# Patient Record
Sex: Female | Born: 1970 | Hispanic: Yes | Marital: Married | Smoking: Never smoker
Health system: Southern US, Community
[De-identification: ages and names within clinical notes are randomized; demographics above are authoritative.]

## PROBLEM LIST (undated history)

## (undated) DIAGNOSIS — E785 Hyperlipidemia, unspecified: Secondary | ICD-10-CM

## (undated) DIAGNOSIS — I10 Essential (primary) hypertension: Secondary | ICD-10-CM

## (undated) DIAGNOSIS — K219 Gastro-esophageal reflux disease without esophagitis: Secondary | ICD-10-CM

## (undated) DIAGNOSIS — K819 Cholecystitis, unspecified: Secondary | ICD-10-CM

## (undated) DIAGNOSIS — E119 Type 2 diabetes mellitus without complications: Secondary | ICD-10-CM

## (undated) DIAGNOSIS — K429 Umbilical hernia without obstruction or gangrene: Secondary | ICD-10-CM

## (undated) HISTORY — DX: Hyperlipidemia, unspecified: E78.5

## (undated) HISTORY — DX: Gastro-esophageal reflux disease without esophagitis: K21.9

## (undated) HISTORY — PX: OTHER SURGICAL HISTORY: SHX169

## (undated) HISTORY — DX: Umbilical hernia without obstruction or gangrene: K42.9

## (undated) HISTORY — DX: Type 2 diabetes mellitus without complications: E11.9

## (undated) HISTORY — DX: Cholecystitis, unspecified: K81.9

---

## 2006-09-09 ENCOUNTER — Ambulatory Visit: Payer: Self-pay | Admitting: Family Medicine

## 2007-09-26 ENCOUNTER — Emergency Department: Payer: Self-pay | Admitting: Emergency Medicine

## 2007-09-27 ENCOUNTER — Ambulatory Visit: Payer: Self-pay | Admitting: Internal Medicine

## 2007-10-09 ENCOUNTER — Emergency Department: Payer: Self-pay | Admitting: Emergency Medicine

## 2010-04-02 ENCOUNTER — Ambulatory Visit: Payer: Self-pay | Admitting: Gastroenterology

## 2010-04-05 LAB — PATHOLOGY REPORT

## 2012-08-23 ENCOUNTER — Ambulatory Visit: Payer: Self-pay | Admitting: Gastroenterology

## 2012-10-06 ENCOUNTER — Ambulatory Visit: Payer: Self-pay | Admitting: Obstetrics and Gynecology

## 2012-10-26 ENCOUNTER — Ambulatory Visit: Payer: Self-pay | Admitting: Gastroenterology

## 2016-07-22 ENCOUNTER — Other Ambulatory Visit: Payer: Self-pay | Admitting: Physician Assistant

## 2016-07-22 DIAGNOSIS — K432 Incisional hernia without obstruction or gangrene: Secondary | ICD-10-CM

## 2016-07-28 ENCOUNTER — Ambulatory Visit
Admission: RE | Admit: 2016-07-28 | Discharge: 2016-07-28 | Disposition: A | Discharge status: Home / Self Care | Payer: PRIVATE HEALTH INSURANCE | Source: Ambulatory Visit | Attending: Physician Assistant | Admitting: Physician Assistant

## 2016-07-28 DIAGNOSIS — Z90721 Acquired absence of ovaries, unilateral: Secondary | ICD-10-CM | POA: Insufficient documentation

## 2016-07-28 DIAGNOSIS — K432 Incisional hernia without obstruction or gangrene: Secondary | ICD-10-CM

## 2016-07-28 DIAGNOSIS — R109 Unspecified abdominal pain: Secondary | ICD-10-CM | POA: Diagnosis present

## 2017-02-04 DIAGNOSIS — E1169 Type 2 diabetes mellitus with other specified complication: Secondary | ICD-10-CM | POA: Insufficient documentation

## 2017-02-04 DIAGNOSIS — Z794 Long term (current) use of insulin: Secondary | ICD-10-CM

## 2017-02-04 DIAGNOSIS — E1142 Type 2 diabetes mellitus with diabetic polyneuropathy: Secondary | ICD-10-CM | POA: Insufficient documentation

## 2017-02-04 DIAGNOSIS — E785 Hyperlipidemia, unspecified: Secondary | ICD-10-CM

## 2017-10-28 ENCOUNTER — Other Ambulatory Visit: Payer: Self-pay | Admitting: Physician Assistant

## 2017-10-28 DIAGNOSIS — R1013 Epigastric pain: Secondary | ICD-10-CM

## 2017-11-09 ENCOUNTER — Ambulatory Visit
Admission: RE | Admit: 2017-11-09 | Discharge: 2017-11-09 | Disposition: A | Discharge status: Home / Self Care | Payer: PRIVATE HEALTH INSURANCE | Source: Ambulatory Visit | Attending: Physician Assistant | Admitting: Physician Assistant

## 2017-11-09 DIAGNOSIS — K8020 Calculus of gallbladder without cholecystitis without obstruction: Secondary | ICD-10-CM | POA: Insufficient documentation

## 2017-11-09 DIAGNOSIS — R1013 Epigastric pain: Secondary | ICD-10-CM

## 2017-11-09 DIAGNOSIS — R160 Hepatomegaly, not elsewhere classified: Secondary | ICD-10-CM | POA: Insufficient documentation

## 2017-12-31 ENCOUNTER — Telehealth: Payer: Self-pay

## 2017-12-31 NOTE — Telephone Encounter (Signed)
Lacey Flynn, Lacey GoAngela K  Jivan Symanski, CMA        Would you please call patient/ Referral is for calculus of gallbladder. Referral is to Dr Earlene Plateravis. Thanks.    Called patient and the phone will only ring and not able to leave a voicemail. I will try again later.

## 2018-01-06 NOTE — Telephone Encounter (Signed)
Called patient and was not able to leave her a voicemail. I will send her a letter in Spanish.  

## 2018-01-19 ENCOUNTER — Ambulatory Visit: Payer: PRIVATE HEALTH INSURANCE | Admitting: Surgery

## 2018-01-19 ENCOUNTER — Encounter: Payer: Self-pay | Admitting: Surgery

## 2018-01-19 VITALS — BP 130/68 | HR 74 | Temp 97.5°F | Ht 62.0 in | Wt 290.0 lb

## 2018-01-19 DIAGNOSIS — R1013 Epigastric pain: Secondary | ICD-10-CM

## 2018-01-19 DIAGNOSIS — G8929 Other chronic pain: Secondary | ICD-10-CM | POA: Diagnosis not present

## 2018-01-19 NOTE — Progress Notes (Signed)
Surgical Clinic History and Physical  Referring provider:  Rosemarie Beath, PA-C [address] [place], [place] [address]  HISTORY OF PRESENT ILLNESS (HPI):  47 y.o. female presents for evaluation of epigastric and upper abdominal pain. Patient reports she first began to experience epigastric and upper abdominal pain 1.5 years ago, at which time her primary care physician ordered for patient an abdominal ultrasound, which reportedly demonstrated gallstones and biliary sludge. She says her pain is worse after eating and particularly worse after fatty foods such as meats, cheeses/dairy, and fried foods. She also describes chronic GERD, for which she was previously prescribed omeprazole, but 6 months ago, her prescription lapsed while she was referred from primary care to endocrinologist. She resumed taking omeprazole over the past week, since which her GERD has substantially improved/resolved, but she continues to experience post-prandial epigastric abdominal pain. Patient also says she underwent EGD and colonoscopy 5 years ago and was told she had polyps, but has not seen GI for follow-up. She last saw endocrinologist with new labs drawn 8/29, though last labs and note in Care Everywhere are from 07/2017. Patient otherwise denies any N/V, blood per rectum, unintentional weight loss, fever/chills, CP, or SOB.   Of note, patient also says she previously underwent laparoscopic removal of unilateral fallopian tube following what sounds like an ectopic pregnancy, at which time she was also diagnosed with an umbilical hernia, which only causes mild discomfort when she holds her granddaughter. She was evaluated by [facility] surgery for hernia repair and was referred for bariatric surgery. However, because she is self-pay and couldn't afford the $10,000, she never pursued bariatric weight loss program any further despite interest and ongoing difficulty with weight loss and diabetes control.  PAST MEDICAL  HISTORY (PMH):  Past Medical History:  Diagnosis Date  . Cholecystitis   . Diabetes mellitus without complication (HCC)   . GERD (gastroesophageal reflux disease)   . Hyperlipidemia   . Umbilical hernia      PAST SURGICAL HISTORY (PSH):  Past Surgical History:  Procedure Laterality Date  . PARTIAL HYSTERECTOMY  1998   Has one ovary     MEDICATIONS:  Prior to Admission medications   Not on File     ALLERGIES:  Allergies  Allergen Reactions  . Metronidazole Other (See Comments)  . Penicillin G Nausea And Vomiting     SOCIAL HISTORY:  Social History   Socioeconomic History  . Marital status: Married    Spouse name: Not on file  . Number of children: Not on file  . Years of education: Not on file  . Highest education level: Not on file  Occupational History  . Not on file  Social Needs  . Financial resource strain: Not on file  . Food insecurity:    Worry: Not on file    Inability: Not on file  . Transportation needs:    Medical: Not on file    Non-medical: Not on file  Tobacco Use  . Smoking status: Never Smoker  . Smokeless tobacco: Never Used  Substance and Sexual Activity  . Alcohol use: Never    Frequency: Never  . Drug use: Never  . Sexual activity: Not on file  Lifestyle  . Physical activity:    Days per week: Not on file    Minutes per session: Not on file  . Stress: Not on file  Relationships  . Social connections:    Talks on phone: Not on file    Gets together: Not  on file    Attends religious service: Not on file    Active member of club or organization: Not on file    Attends meetings of clubs or organizations: Not on file    Relationship status: Not on file  . Intimate partner violence:    Fear of current or ex partner: Not on file    Emotionally abused: Not on file    Physically abused: Not on file    Forced sexual activity: Not on file  Other Topics Concern  . Not on file  Social History Narrative  . Not on file    The patient  currently resides (home / rehab facility / nursing home): Home The patient normally is (ambulatory / bedbound): Ambulatory  FAMILY HISTORY:  No family history on file.  Otherwise negative/non-contributory.  REVIEW OF SYSTEMS:  Constitutional: denies any other weight loss, fever, chills, or sweats  Eyes: denies any other vision changes, history of eye injury  ENT: denies sore throat, hearing problems  Respiratory: denies shortness of breath, wheezing  Cardiovascular: denies chest pain, palpitations  Gastrointestinal: abdominal pain, N/V, and bowel function as per HPI Musculoskeletal: denies any other joint pains or cramps  Skin: Denies any other rashes or skin discolorations Neurological: denies any other headache, dizziness, weakness  Psychiatric: Denies any other depression, anxiety   All other review of systems were otherwise negative   VITAL SIGNS:  BP 130/68   Pulse 74   Temp (!) 97.5 F (36.4 C) (Skin)   Ht 5\' 2"  (1.575 m)   Wt 290 lb (131.5 kg)   BMI 53.04 kg/m   PHYSICAL EXAM:  Constitutional:  -- Morbidly obese body habitus  -- Awake, alert, and oriented x3  Eyes:  -- Pupils equally round and reactive to light  -- No scleral icterus  Ear, nose, throat:  -- No jugular venous distension -- No nasal drainage, bleeding Pulmonary:  -- No crackles  -- Equal decreased breath sounds bilaterally, attributable to body habitus -- Breathing non-labored at rest Cardiovascular:  -- S1, S2 present  -- No pericardial rubs  Gastrointestinal:  -- Abdomen soft and obese, but non-distended, with mild epigastric and RUQ tenderness to deep palpation, no guarding/rebound tenderness  -- No abdominal masses appreciated, pulsatile or otherwise; specifically unable to appreciate on exam any umbilical hernia Musculoskeletal and Integumentary:  -- Wounds or skin discoloration: None appreciated -- Extremities: B/L UE and LE FROM, hands and feet warm Neurologic:  -- Motor function:  Intact and symmetric -- Sensation: Intact and symmetric  Labs: CBC: No results found for: WBC, RBC BMP: No results found for: GLUCOSE, POTASSIUM, CHLORIDE, CO2, BUN, CREATININE, CALCIUM   Chemistries (07/23/2017): Glucose 166, BUN 5, Cr 0.5, Na 136, K 4.3, Cl 101, CO2 31.1, Albumin 3.7, Bilirubin (total) 0.3, AST 23, ALT 27   HbA1C (07/23/2017): 9.9  Imaging studies:  Limited RUQ Abdominal Ultrasound (11/09/2017) Gallbladder: Mixed echogenicity material layer dependently within the gallbladder. Negative sonographic Murphy's sign. No gallbladder distention. No gallbladder wall thickening or pericholecystic fluid. Common bile duct: Diameter: 2 mm-3 mm  Heterogeneous liver echotexture with no nodular contour. Flow within the hepatic vasculature. Estimated right liver cranial caudal span is greater than 25 cm.   Assessment/Plan:  47 y.o. female with symptomatic cholelithiasis, complicated by GERD and by co-morbidities including morbid obesity (BMI >53), poorly controlled DM, HLD, GERD, and chronic lower back pain.   - will contact endocrinologist for recent HbA1C and to confirm glucose control, repeat HbA1C if none more  recent than 07/2017  - recommended referral to financial counseling for Medicaid or financial assistance not only for cholecystectomy, but for anticipated bariatric surgery/program and long-term care  - all risks, benefits, and alternatives to cholecystectomy were discussed with the patient, all of her questions were answered to her expressed satisfaction, patient expresses she wishes to proceed, and informed consent was obtained.  - anticipate post-surgical referral to GI if residual symptoms and to bariatric surgery pending financial counseling  - will plan for laparoscopic cholecystectomy +/- primary repair of umbilical hernia, though emphasized this is not the priority of surgery and will only be considered if surgery is easy   - anticipate return to clinic 2 weeks following  above elective procedure  - instructed to call if any questions or concerns  All of the above recommendations were discussed with the patient and patient's family, and all of patient's and family's questions were answered to their expressed satisfaction.  Thank you for the opportunity to participate in this patient's care.  -- Scherrie GerlachJason E. Earlene Plateravis, MD, RPVI [place]: [facility] General Surgery - Partnering for exceptional care. Office: [phone]

## 2018-01-19 NOTE — Patient Instructions (Signed)
Colecistostomía, cuidados posteriores °(Cholecystostomy, Care After) °Siga estas instrucciones durante las próximas semanas. Estas indicaciones le proporcionan información acerca de cómo deberá cuidarse después del procedimiento. El médico también podrá darle instrucciones más específicas. El tratamiento ha sido planificado según las prácticas médicas actuales, pero en algunos casos pueden ocurrir problemas. Comuníquese con el médico si tiene algún problema o dudas después del procedimiento. °QUÉ ESPERAR DESPUÉS DEL PROCEDIMIENTO °Después del procedimiento, es común sentir dolor cerca del lugar del tubo de drenaje (catéter) o de la incisión. °INSTRUCCIONES PARA EL CUIDADO EN EL HOGAR °Cuidado de la incisión °· Siga las indicaciones del médico acerca del cuidado de la incisión. Haga lo siguiente: °? Lávese las manos con agua y jabón antes de cambiar las vendas (vendaje). Use desinfectante para manos si no dispone de agua y jabón. °? Cambie el vendaje como se lo haya indicado el médico. °? No retire los puntos (suturas), el adhesivo para la piel o las tiras adhesivas. Es posible que estos deban quedar puestos en la piel durante 2 semanas o más tiempo. Si los bordes de las tiras adhesivas empiezan a despegarse y enroscarse, puede recortar los que están sueltos. No retire las tiras adhesivas por completo a menos que el médico se lo indique. °· Controle el lugar de la incisión y del drenaje todos los días para detectar signos de infección. Esté atento a lo siguiente: °? Dolor, hinchazón o enrojecimiento. °? Líquido, sangre o pus. °Instrucciones generales °· Si lo enviaron a su casa con un drenaje quirúrgico colocado, siga las indicaciones del médico sobre cómo cuidarlo y cómo cuidar la bolsa de recolección. °· No tome baños de inmersión, no nade ni use el jacuzzi hasta que el médico lo autorice. Pregúntele al médico si puede ducharse. Tal vez solo le permitan tomar baños de esponja. °· Siga las indicaciones del médico  respecto de lo que puede comer o beber. °· Tome los medicamentos de venta libre y los recetados solamente como se lo haya indicado el médico. °· Concurra a todas las visitas de control como se lo haya indicado el médico. Esto es importante. °SOLICITE ATENCIÓN MÉDICA SI: °· Hay enrojecimiento, hinchazón o dolor en el lugar de la incisión o del drenaje. °· Tiene náuseas o vómitos. °SOLICITE ATENCIÓN MÉDICA DE INMEDIATO SI: °· El dolor abdominal empeora. °· Siente mareos o se desmaya mientras está de pie. °· Observa líquido, sangre o pus que emanan del lugar de la incisión o del drenaje. °· Tiene fiebre. °· Le falta el aire. °· Tiene una frecuencia cardíaca acelerada. °· Los vómitos o las náuseas no desaparecen. °· El tubo de drenaje se obstruye. °· El tubo de drenaje se sale del abdomen. °Esta información no tiene como fin reemplazar el consejo del médico. Asegúrese de hacerle al médico cualquier pregunta que tenga. °Document Released: 01/10/2015 Document Revised: 01/10/2015 Document Reviewed: 08/02/2014 °Elsevier Interactive Patient Education © 2018 [employer] Inc[employer] ° °

## 2018-01-20 ENCOUNTER — Telehealth: Payer: Self-pay

## 2018-01-20 ENCOUNTER — Other Ambulatory Visit
Admission: RE | Admit: 2018-01-20 | Discharge: 2018-01-20 | Disposition: A | Discharge status: Home / Self Care | Payer: PRIVATE HEALTH INSURANCE | Source: Ambulatory Visit | Attending: Surgery | Admitting: Surgery

## 2018-01-20 ENCOUNTER — Other Ambulatory Visit: Payer: Self-pay

## 2018-01-20 DIAGNOSIS — Z794 Long term (current) use of insulin: Secondary | ICD-10-CM | POA: Insufficient documentation

## 2018-01-20 DIAGNOSIS — E1142 Type 2 diabetes mellitus with diabetic polyneuropathy: Secondary | ICD-10-CM | POA: Diagnosis not present

## 2018-01-20 NOTE — Telephone Encounter (Signed)
Called patient but spoke with daughter in reference to her pre-admit appointment for tomorrow at 7:30 AM at the [facility]. Then I reminded them that they will need to call on Friday from 1-3 PM to [phone]. They understood and had no further questions.

## 2018-01-21 ENCOUNTER — Telehealth: Payer: Self-pay

## 2018-01-21 ENCOUNTER — Encounter
Admission: RE | Admit: 2018-01-21 | Discharge: 2018-01-21 | Disposition: A | Discharge status: Home / Self Care | Payer: PRIVATE HEALTH INSURANCE | Source: Ambulatory Visit | Attending: Surgery | Admitting: Surgery

## 2018-01-21 ENCOUNTER — Other Ambulatory Visit: Payer: Self-pay

## 2018-01-21 DIAGNOSIS — E119 Type 2 diabetes mellitus without complications: Secondary | ICD-10-CM | POA: Diagnosis not present

## 2018-01-21 DIAGNOSIS — Z01818 Encounter for other preprocedural examination: Secondary | ICD-10-CM | POA: Insufficient documentation

## 2018-01-21 LAB — BASIC METABOLIC PANEL
Anion gap: 7 (ref 5–15)
BUN: 7 mg/dL (ref 6–20)
CHLORIDE: 103 mmol/L (ref 98–111)
CO2: 27 mmol/L (ref 22–32)
Calcium: 8.5 mg/dL — ABNORMAL LOW (ref 8.9–10.3)
Creatinine, Ser: 0.51 mg/dL (ref 0.44–1.00)
GFR calc Af Amer: >60 mL/min (ref >60)
GFR calc non Af Amer: >60 mL/min (ref >60)
GLUCOSE: 268 mg/dL — AB (ref 70–99)
POTASSIUM: 3.9 mmol/L (ref 3.5–5.1)
SODIUM: 137 mmol/L (ref 135–145)

## 2018-01-21 LAB — CBC
HCT: 38.8 % (ref 35.0–47.0)
HEMOGLOBIN: 12.9 g/dL (ref 12.0–16.0)
MCH: 28.1 pg (ref 26.0–34.0)
MCHC: 33.2 g/dL (ref 32.0–36.0)
MCV: 84.7 fL (ref 80.0–100.0)
Platelets: 314 10*3/uL (ref 150–440)
RBC: 4.58 MIL/uL (ref 3.80–5.20)
RDW: 15 % — ABNORMAL HIGH (ref 11.5–14.5)
WBC: 11.3 10*3/uL — ABNORMAL HIGH (ref 3.6–11.0)

## 2018-01-21 LAB — HEMOGLOBIN A1C
Hgb A1c MFr Bld: 11.2 % — ABNORMAL HIGH (ref 4.8–5.6)
Mean Plasma Glucose: 275 mg/dL

## 2018-01-21 NOTE — Telephone Encounter (Signed)
Dr. Earlene Plateravis wanted me to call patient to let her know that her surgery will be cancelled due to her A1C being a 11.2%. He stated that her sugar levels needed to be under control before he and the anesthesiologist could do surgery on her.  I called patient and had to leave her a voicemail letting her know that she needed to return my call.

## 2018-01-21 NOTE — Pre-Procedure Instructions (Signed)
No orders were received for this patient prior to her pre-admission visit. I proceeded with history and general instructions related to anesthesia. I ordered labs according to anesthesia protocol.

## 2018-01-21 NOTE — Telephone Encounter (Signed)
Patient has called back. Please call patient back when you get a moment per your request. I did confirm her phone number at [phone].

## 2018-01-21 NOTE — Telephone Encounter (Signed)
Called patient back and informed her that her surgery was cancelled until further notice. I also told her that the anesthesiologist and Dr. Earlene Plateravis were not able to do her surgery until her A1C was normal and not elevated as now. I also told patient that I would send her endocrinologist information about her A1C so it could be adjusted for her diabetes to be in control. Patient understood and agreed with Dr. Jinny Sandersavis's recommendations. However, I told patient that if her abdominal pain was uncontrolled, to please go to the emergency room. Patient understood and had no further questions.

## 2018-01-21 NOTE — Patient Instructions (Signed)
Your procedure is scheduled on: Monday 01/25/18  Report to DAY SURGERY DEPARTMENT LOCATED ON 2ND FLOOR MEDICAL MALL ENTRANCE. To find out your arrival time please call [phone] between 1PM - 3PM on Friday 01/22/18  Remember: Instructions that are not followed completely may result in serious medical risk, up to and including death, or upon the discretion of your surgeon and anesthesiologist your surgery may need to be rescheduled.     _X__ 1. Do not eat food after midnight the night before your procedure.                 No gum chewing or hard candies. You may drink SUGAR FREE clear liquids up to 2 hours                 before you are scheduled to arrive for your surgery- DO not drink clear                 liquids within 2 hours of the start of your surgery.                  __X__2.  On the morning of surgery brush your teeth with toothpaste and water, you may rinse your mouth with mouthwash if you wish.  Do not swallow any toothpaste of mouthwash.     _X__ 3.  No Alcohol for 24 hours before or after surgery.   _X__ 4.  Do Not Smoke or use e-cigarettes For 24 Hours Prior to Your Surgery.                 Do not use any chewable tobacco products for at least 6 hours prior to                 surgery.  ____  5.  Bring all medications with you on the day of surgery if instructed.   __X__  6.  Notify your doctor if there is any change in your medical condition      (cold, fever, infections).     Do not wear jewelry, make-up, hairpins, clips or nail polish. Do not wear lotions, powders, or perfumes. You may take deodorant. Do not shave 48 hours prior to surgery. Men may shave face and neck. Do not bring valuables to the hospital.    [facility] is not responsible for any belongings or valuables.  Contacts, dentures/partials or body piercings may not be worn into surgery. Bring a case for your contacts, glasses or hearing aids, a denture cup will be supplied. Leave your suitcase in the  car. After surgery it may be brought to your room. For patients admitted to the hospital, discharge time is determined by your treatment team.   Patients discharged the day of surgery will not be allowed to drive home.   Please read over the following fact sheets that you were given:   MRSA Information  __X__ Take these medicines the morning of surgery with A SIP OF WATER:     1. omeprazole (PRILOSEC) 20 MG capsule  2. acetaminophen (TYLENOL) 500 MG tablet  3.   4.  5.  6.   __X__ Use CHG Soap as directed     ____ Take 1/2 of usual insulin dose the night before surgery. No insulin the morning          of surgery.   __X__ Stop Blood Thinners Coumadin/Plavix/Xarelto/Pleta/Pradaxa/Eliquis/Effient/Aspirin   __X__ Stop Anti-inflammatories 7 days before surgery such as Advil, Ibuprofen, Motrin, BC or  Goodies Powder, Naprosyn, Naproxen, Aleve, Aspirin, Meloxicam. May take Tylenol if needed for pain or discomfort.

## 2018-01-25 ENCOUNTER — Encounter: Admission: RE | Payer: Self-pay | Source: Ambulatory Visit

## 2018-01-25 ENCOUNTER — Telehealth: Payer: Self-pay | Admitting: Gastroenterology

## 2018-01-25 ENCOUNTER — Encounter: Payer: Self-pay | Admitting: Gastroenterology

## 2018-01-25 ENCOUNTER — Ambulatory Visit: Admission: RE | Admit: 2018-01-25 | Payer: PRIVATE HEALTH INSURANCE | Source: Ambulatory Visit | Admitting: Surgery

## 2018-01-25 ENCOUNTER — Ambulatory Visit (INDEPENDENT_AMBULATORY_CARE_PROVIDER_SITE_OTHER): Payer: PRIVATE HEALTH INSURANCE | Admitting: Gastroenterology

## 2018-01-25 VITALS — BP 125/82 | HR 87 | Ht 62.0 in | Wt 291.8 lb

## 2018-01-25 DIAGNOSIS — R1030 Lower abdominal pain, unspecified: Secondary | ICD-10-CM | POA: Diagnosis not present

## 2018-01-25 SURGERY — LAPAROSCOPIC CHOLECYSTECTOMY
Anesthesia: General

## 2018-01-25 NOTE — Telephone Encounter (Signed)
error 

## 2018-01-25 NOTE — Progress Notes (Signed)
Wyline Mood MD, MRCP(U.K) [address]  [address]  [place], [place] [address]  Main: [phone]  Fax: [phone]   Gastroenterology Consultation  Referring Provider:     Tula Nakayama* Primary Care Physician:  Rosemarie Beath, PA-C Primary Gastroenterologist:  Dr. Wyline Mood  Reason for Consultation:     Abdominal pain         HPI:   Lacey Flynn is a 47 y.o. y/o female referred for consultation & management  by Dr. Mauri Reading, Wendee Copp, PA-C.    She has been referred for abdominal pain.  Here with an interpretor  Abdominal pain: Onset: 6-7 years, over the years has been the same . Occurs at times 2-3 times a week and sometimes no pain for weeks. When she does get the pain it can last ftill she has a bowel movement when it subsized  Site: lower abdomen , inside Radiation: localized  Severity :10/10  Nature of pain: intense , like a twisting  Aggravating factors: nothing  Relieving factors :bowel movement , tylenol  Weight loss: up and down - stable  NSAID use: Ibuprofen -stopped  PPI use :Omeprazole BI- helps with relflux  Gall bladder surgery: no  Frequency of bowel movements: 2-3 times a day -sometimes solid and sometimes like diarrhea  Change in bowel movements: no  Relief with bowel movements: yes  Gas/Bloating/Abdominal distension: yes    She has seen Dr Earlene Plater in surgery for the pain . Plan was for a cholecystectomy with umbilical hernia repair, surgery was postponed due to elevated Hba1c.   RUQ USG 11/09/17 shows cholilithiasis/sludge. Hepatomegaly and steatosis. No recent LFT's .  She does not consumes any artificial sugars. She has had a colonoscopy a,d EGD- last performed in 2014 - she recalls it was "twisted colon" No family history of colon cancer. Sister had pancreatic cancer.  Past Medical History:  Diagnosis Date  . Cholecystitis   . Diabetes mellitus without complication (HCC)   . GERD (gastroesophageal reflux  disease)   . Hyperlipidemia   . Umbilical hernia     Past Surgical History:  Procedure Laterality Date  . salpingooferectomy     Has one ovary & uterus    Prior to Admission medications   Medication Sig Start Date End Date Taking? Authorizing Provider  acetaminophen (TYLENOL) 500 MG tablet Take 1,000 mg by mouth every 6 (six) hours as needed for moderate pain or headache.    [provider]  atorvastatin (LIPITOR) 80 MG tablet Take 80 mg by mouth at bedtime.    [provider]  BYDUREON BCISE 2 MG/0.85ML AUIJ Take 2 mg by mouth every Saturday.  12/18/17   [provider]  omeprazole (PRILOSEC) 20 MG capsule Take 20 mg by mouth 2 (two) times daily.    [provider]  polyvinyl alcohol (ARTIFICIAL TEARS) 1.4 % ophthalmic solution Place 2 drops into both eyes daily.    [provider]  TRESIBA FLEXTOUCH 200 UNIT/ML SOPN Inject 100 Units into the skin daily at 12 noon.  12/19/17   [provider]    Family History  Problem Relation Age of Onset  . Diabetes Mother   . Pancreatic cancer Sister      Social History   Tobacco Use  . Smoking status: Never Smoker  . Smokeless tobacco: Never Used  Substance Use Topics  . Alcohol use: Never    Frequency: Never  . Drug use: Never    Allergies as  of 01/25/2018 - Review Complete 01/21/2018  Allergen Reaction Noted  . Metronidazole Other (See Comments) 09/02/2016  . Penicillin g Nausea And Vomiting and Other (See Comments) 09/02/2016    Review of Systems:    All systems reviewed and negative except where noted in HPI.   Physical Exam:  There were no vitals taken for this visit. No LMP recorded. Patient is perimenopausal. Psych:  Alert and cooperative. Normal mood and affect. General:   Alert,  Well-developed, well-nourished, pleasant and cooperative in NAD Head:  Normocephalic and atraumatic. Eyes:  Sclera clear, no icterus.   Conjunctiva pink. Ears:  Normal auditory  acuity. Nose:  No deformity, discharge, or lesions. Mouth:  No deformity or lesions,oropharynx pink & moist. Neck:  Supple; no masses or thyromegaly. Lungs:  Respirations even and unlabor.  ed.  Clear throughout to auscultation.   No wheezes, crackles, or rhonchi. No acute distress. Heart:  Regular rate and rhythm; no murmurs, clicks, rubs, or gallops. Abdomen:  Normal bowel sounds.  No bruits.  Soft, non-tender and non-distended without masses, hepatosplenomegaly or hernias noted.  No guarding or rebound tenderness.    Neurologic:  Alert and oriented x3;  grossly normal neurologically. Skin:  Intact without significant lesions or rashes. No jaundice. Lymph Nodes:  No significant cervical adenopathy. Psych:  Alert and cooperative. Normal mood and affect.  Imaging Studies: No results found.  Assessment and Plan:   Lacey Flynn is a 47 y.o. y/o female has been referred for  abdominal pain . History and description of pain suggests IBS-C Plan  1. LFT's  2. Continue  PPI 3.  Obtain prior EGD+colonoscopy report. 4.  Trial of Linzess 145 mcg - if no better at next visit will increase to 290 MCG  History was obtained via translator who was present in person during the visit.   Follow up in 7-10 days   Dr Wyline MoodKiran Kailani Brass MD,MRCP(U.K)

## 2018-01-26 LAB — HEPATIC FUNCTION PANEL
ALBUMIN: 4.1 g/dL (ref 3.5–5.5)
ALT: 34 IU/L — ABNORMAL HIGH (ref 0–32)
AST: 21 IU/L (ref 0–40)
Alkaline Phosphatase: 165 IU/L — ABNORMAL HIGH (ref 39–117)
BILIRUBIN TOTAL: 0.3 mg/dL (ref 0.0–1.2)
BILIRUBIN, DIRECT: 0.1 mg/dL (ref 0.00–0.40)
TOTAL PROTEIN: 7.7 g/dL (ref 6.0–8.5)

## 2018-01-26 LAB — TSH: TSH: 2.05 u[IU]/mL (ref 0.450–4.500)

## 2018-01-28 ENCOUNTER — Telehealth: Payer: Self-pay | Admitting: Gastroenterology

## 2018-01-28 NOTE — Telephone Encounter (Signed)
Pt says the medication Dr. Tobi Bastos prescribed is working really well.

## 2018-01-31 ENCOUNTER — Inpatient Hospital Stay
Admission: EM | Admit: 2018-01-31 | Discharge: 2018-02-02 | DRG: 872 | Disposition: A | Discharge status: Home / Self Care | Payer: PRIVATE HEALTH INSURANCE | Attending: Internal Medicine | Admitting: Internal Medicine

## 2018-01-31 ENCOUNTER — Emergency Department: Payer: PRIVATE HEALTH INSURANCE

## 2018-01-31 ENCOUNTER — Other Ambulatory Visit: Payer: Self-pay

## 2018-01-31 DIAGNOSIS — Z888 Allergy status to other drugs, medicaments and biological substances status: Secondary | ICD-10-CM | POA: Diagnosis not present

## 2018-01-31 DIAGNOSIS — E119 Type 2 diabetes mellitus without complications: Secondary | ICD-10-CM | POA: Diagnosis present

## 2018-01-31 DIAGNOSIS — A419 Sepsis, unspecified organism: Secondary | ICD-10-CM | POA: Diagnosis not present

## 2018-01-31 DIAGNOSIS — K760 Fatty (change of) liver, not elsewhere classified: Secondary | ICD-10-CM | POA: Diagnosis present

## 2018-01-31 DIAGNOSIS — Z794 Long term (current) use of insulin: Secondary | ICD-10-CM | POA: Diagnosis not present

## 2018-01-31 DIAGNOSIS — Z6841 Body Mass Index (BMI) 40.0 and over, adult: Secondary | ICD-10-CM

## 2018-01-31 DIAGNOSIS — R079 Chest pain, unspecified: Secondary | ICD-10-CM | POA: Diagnosis not present

## 2018-01-31 DIAGNOSIS — Z79899 Other long term (current) drug therapy: Secondary | ICD-10-CM | POA: Diagnosis not present

## 2018-01-31 DIAGNOSIS — Z833 Family history of diabetes mellitus: Secondary | ICD-10-CM | POA: Diagnosis not present

## 2018-01-31 DIAGNOSIS — E785 Hyperlipidemia, unspecified: Secondary | ICD-10-CM | POA: Diagnosis present

## 2018-01-31 DIAGNOSIS — Z880 Allergy status to penicillin: Secondary | ICD-10-CM

## 2018-01-31 DIAGNOSIS — K219 Gastro-esophageal reflux disease without esophagitis: Secondary | ICD-10-CM | POA: Diagnosis present

## 2018-01-31 DIAGNOSIS — K8020 Calculus of gallbladder without cholecystitis without obstruction: Secondary | ICD-10-CM

## 2018-01-31 DIAGNOSIS — I10 Essential (primary) hypertension: Secondary | ICD-10-CM | POA: Diagnosis present

## 2018-01-31 DIAGNOSIS — Z800 Family history of malignant neoplasm of digestive organs: Secondary | ICD-10-CM | POA: Diagnosis not present

## 2018-01-31 DIAGNOSIS — E876 Hypokalemia: Secondary | ICD-10-CM | POA: Diagnosis present

## 2018-01-31 DIAGNOSIS — K429 Umbilical hernia without obstruction or gangrene: Secondary | ICD-10-CM | POA: Diagnosis present

## 2018-01-31 DIAGNOSIS — N3000 Acute cystitis without hematuria: Secondary | ICD-10-CM | POA: Diagnosis present

## 2018-01-31 DIAGNOSIS — N390 Urinary tract infection, site not specified: Secondary | ICD-10-CM

## 2018-01-31 HISTORY — DX: Essential (primary) hypertension: I10

## 2018-01-31 LAB — CBC
HEMATOCRIT: 39.8 % (ref 35.0–47.0)
Hemoglobin: 13.2 g/dL (ref 12.0–16.0)
MCH: 28.3 pg (ref 26.0–34.0)
MCHC: 33.2 g/dL (ref 32.0–36.0)
MCV: 85.1 fL (ref 80.0–100.0)
PLATELETS: 334 10*3/uL (ref 150–440)
RBC: 4.68 MIL/uL (ref 3.80–5.20)
RDW: 14.9 % — ABNORMAL HIGH (ref 11.5–14.5)
WBC: 15.5 10*3/uL — AB (ref 3.6–11.0)

## 2018-01-31 LAB — URINALYSIS, COMPLETE (UACMP) WITH MICROSCOPIC
Bilirubin Urine: NEGATIVE
Glucose, UA: >=500 mg/dL — AB
HGB URINE DIPSTICK: NEGATIVE
Ketones, ur: NEGATIVE mg/dL
LEUKOCYTES UA: NEGATIVE
Nitrite: NEGATIVE
PROTEIN: 100 mg/dL — AB
Specific Gravity, Urine: 1.02 (ref 1.005–1.030)
pH: 7 (ref 5.0–8.0)

## 2018-01-31 LAB — BASIC METABOLIC PANEL
Anion gap: 14 (ref 5–15)
BUN: 8 mg/dL (ref 6–20)
CHLORIDE: 98 mmol/L (ref 98–111)
CO2: 23 mmol/L (ref 22–32)
CREATININE: 0.69 mg/dL (ref 0.44–1.00)
Calcium: 8.6 mg/dL — ABNORMAL LOW (ref 8.9–10.3)
GFR calc non Af Amer: >60 mL/min (ref >60)
Glucose, Bld: 359 mg/dL — ABNORMAL HIGH (ref 70–99)
POTASSIUM: 3.1 mmol/L — AB (ref 3.5–5.1)
SODIUM: 135 mmol/L (ref 135–145)

## 2018-01-31 LAB — POCT PREGNANCY, URINE: PREG TEST UR: NEGATIVE

## 2018-01-31 LAB — GLUCOSE, CAPILLARY
Glucose-Capillary: 272 mg/dL — ABNORMAL HIGH (ref 70–99)
Glucose-Capillary: 275 mg/dL — ABNORMAL HIGH (ref 70–99)

## 2018-01-31 LAB — PROTIME-INR
INR: 0.99
PROTHROMBIN TIME: 13 s (ref 11.4–15.2)

## 2018-01-31 LAB — HEPATIC FUNCTION PANEL
ALT: 49 U/L — ABNORMAL HIGH (ref 0–44)
AST: 46 U/L — ABNORMAL HIGH (ref 15–41)
Albumin: 3.5 g/dL (ref 3.5–5.0)
Alkaline Phosphatase: 127 U/L — ABNORMAL HIGH (ref 38–126)
Total Bilirubin: 0.7 mg/dL (ref 0.3–1.2)
Total Protein: 7.9 g/dL (ref 6.5–8.1)

## 2018-01-31 LAB — LIPASE, BLOOD: Lipase: 30 U/L (ref 11–51)

## 2018-01-31 LAB — LACTIC ACID, PLASMA
Lactic Acid, Venous: 3.8 mmol/L (ref 0.5–1.9)
Lactic Acid, Venous: 1.9 mmol/L (ref 0.5–1.9)

## 2018-01-31 LAB — TROPONIN I: Troponin I: <0.03 ng/mL (ref <0.03)

## 2018-01-31 MED ORDER — PANTOPRAZOLE SODIUM 40 MG PO TBEC
40.0000 mg | DELAYED_RELEASE_TABLET | Freq: Every day | ORAL | Status: DC
  Administered 2018-01-31: 40 mg via ORAL
  Filled 2018-01-31: qty 1

## 2018-01-31 MED ORDER — ONDANSETRON HCL 4 MG PO TABS: 4.0000 mg | ORAL_TABLET | Freq: Four times a day (QID) | ORAL | Status: DC | PRN

## 2018-01-31 MED ORDER — HYDROCODONE-ACETAMINOPHEN 5-325 MG PO TABS: 1.0000 | ORAL_TABLET | ORAL | Status: DC | PRN

## 2018-01-31 MED ORDER — INSULIN ASPART 100 UNIT/ML ~~LOC~~ SOLN
0.0000 [IU] | Freq: Three times a day (TID) | SUBCUTANEOUS | Status: DC
  Administered 2018-01-31: 5 [IU] via SUBCUTANEOUS
  Administered 2018-02-01: 3 [IU] via SUBCUTANEOUS
  Administered 2018-02-01 (×2): 2 [IU] via SUBCUTANEOUS
  Administered 2018-02-02: 3 [IU] via SUBCUTANEOUS
  Administered 2018-02-02: 2 [IU] via SUBCUTANEOUS
  Filled 2018-01-31 (×6): qty 1

## 2018-01-31 MED ORDER — EXENATIDE ER 2 MG/0.85ML ~~LOC~~ AUIJ: 2.0000 mg | AUTO-INJECTOR | SUBCUTANEOUS | Status: DC

## 2018-01-31 MED ORDER — ENOXAPARIN SODIUM 40 MG/0.4ML ~~LOC~~ SOLN
40.0000 mg | SUBCUTANEOUS | Status: DC
  Administered 2018-01-31: 40 mg via SUBCUTANEOUS
  Filled 2018-01-31: qty 0.4

## 2018-01-31 MED ORDER — ONDANSETRON HCL 4 MG/2ML IJ SOLN
4.0000 mg | Freq: Once | INTRAMUSCULAR | Status: AC
  Administered 2018-01-31: 4 mg via INTRAVENOUS
  Filled 2018-01-31: qty 2

## 2018-01-31 MED ORDER — ACETAMINOPHEN 650 MG RE SUPP: 650.0000 mg | Freq: Four times a day (QID) | RECTAL | Status: DC | PRN

## 2018-01-31 MED ORDER — SODIUM CHLORIDE 0.9 % IV SOLN
1.0000 g | INTRAVENOUS | Status: DC
  Administered 2018-02-01: 1 g via INTRAVENOUS
  Filled 2018-01-31 (×2): qty 10

## 2018-01-31 MED ORDER — ACETAMINOPHEN 325 MG PO TABS: 650.0000 mg | ORAL_TABLET | Freq: Four times a day (QID) | ORAL | Status: DC | PRN

## 2018-01-31 MED ORDER — SODIUM CHLORIDE 0.9 % IV BOLUS
1000.0000 mL | Freq: Once | INTRAVENOUS | Status: AC
  Administered 2018-01-31: 1000 mL via INTRAVENOUS

## 2018-01-31 MED ORDER — TRAZODONE HCL 50 MG PO TABS: 25.0000 mg | ORAL_TABLET | Freq: Every evening | ORAL | Status: DC | PRN

## 2018-01-31 MED ORDER — SODIUM CHLORIDE 0.9 % IV SOLN
INTRAVENOUS | Status: DC
  Administered 2018-01-31 – 2018-02-02 (×3): via INTRAVENOUS

## 2018-01-31 MED ORDER — DOCUSATE SODIUM 100 MG PO CAPS
100.0000 mg | ORAL_CAPSULE | Freq: Two times a day (BID) | ORAL | Status: DC
  Administered 2018-01-31 – 2018-02-02 (×4): 100 mg via ORAL
  Filled 2018-01-31 (×4): qty 1

## 2018-01-31 MED ORDER — ACETAMINOPHEN 500 MG PO TABS: 1000.0000 mg | ORAL_TABLET | Freq: Four times a day (QID) | ORAL | Status: DC | PRN

## 2018-01-31 MED ORDER — MORPHINE SULFATE (PF) 4 MG/ML IV SOLN
4.0000 mg | Freq: Once | INTRAVENOUS | Status: AC
  Administered 2018-01-31: 4 mg via INTRAVENOUS
  Filled 2018-01-31: qty 1

## 2018-01-31 MED ORDER — SODIUM CHLORIDE 0.9 % IV SOLN
1.0000 g | INTRAVENOUS | Status: DC
  Administered 2018-01-31: 1 g via INTRAVENOUS
  Filled 2018-01-31: qty 10

## 2018-01-31 MED ORDER — INSULIN DEGLUDEC 200 UNIT/ML ~~LOC~~ SOPN: 100.0000 [IU] | PEN_INJECTOR | Freq: Every day | SUBCUTANEOUS | Status: DC

## 2018-01-31 MED ORDER — MORPHINE SULFATE (PF) 4 MG/ML IV SOLN: 4.0000 mg | Freq: Once | INTRAVENOUS | Status: DC

## 2018-01-31 MED ORDER — BISACODYL 5 MG PO TBEC: 5.0000 mg | DELAYED_RELEASE_TABLET | Freq: Every day | ORAL | Status: DC | PRN

## 2018-01-31 MED ORDER — ONDANSETRON HCL 4 MG/2ML IJ SOLN: 4.0000 mg | Freq: Four times a day (QID) | INTRAMUSCULAR | Status: DC | PRN

## 2018-01-31 MED ORDER — INSULIN GLARGINE 100 UNIT/ML ~~LOC~~ SOLN
100.0000 [IU] | Freq: Every day | SUBCUTANEOUS | Status: DC
  Administered 2018-01-31: 100 [IU] via SUBCUTANEOUS
  Filled 2018-01-31 (×2): qty 1

## 2018-01-31 MED ORDER — IOHEXOL 350 MG/ML SOLN
75.0000 mL | Freq: Once | INTRAVENOUS | Status: AC | PRN
  Administered 2018-01-31: 75 mL via INTRAVENOUS

## 2018-01-31 MED ORDER — POTASSIUM CHLORIDE CRYS ER 20 MEQ PO TBCR
40.0000 meq | EXTENDED_RELEASE_TABLET | ORAL | Status: AC
  Administered 2018-01-31: 40 meq via ORAL
  Filled 2018-01-31: qty 2

## 2018-01-31 MED ORDER — HYDROMORPHONE HCL 1 MG/ML IJ SOLN
1.0000 mg | Freq: Once | INTRAMUSCULAR | Status: AC
  Administered 2018-01-31: 1 mg via INTRAVENOUS
  Filled 2018-01-31: qty 1

## 2018-01-31 NOTE — H&P (Signed)
[facility] - [place] [facility], [place]   PATIENT NAME: Lacey Flynn    MR#:  [id number]  DATE OF BIRTH:  1970-11-01  DATE OF ADMISSION:  01/31/2018  PRIMARY CARE PHYSICIAN: Rosemarie Beath, PA-C   REQUESTING/REFERRING PHYSICIAN:   CHIEF COMPLAINT:   Chief Complaint  Patient presents with  . Chest Pain    HISTORY OF PRESENT ILLNESS:  Lacey Flynn  is a 47 y.o. female with a known history of hypertension, diabetes mellitus type 2 comes in because of chest and upper pleural pain with dizziness when she was eating breakfast this morning.  Patient also noted to see some stars as per her daughter-in-law, brought to the ER immediately because of chest pain she was found to be severely tachycardic with heart rate 170 bpm so she had work-up for chest pain including EKG, cardiac markers, CT angios chest all of them are negative for acute ischemia.  Patient found to have UTI and sepsis.  Received 2 L of fluid in the emergency room and heart rate improved from 1 70-1 09 now.  Patient daughter-in-law interpreted for me in English according to her patient has been having some chills since yesterday, body aches.  No shortness of breath, no nausea, no abdominal pain.  Patient denies any dysuria or hematuria.  PAST MEDICAL HISTORY:   Past Medical History:  Diagnosis Date  . Cholecystitis   . Diabetes mellitus without complication (HCC)   . GERD (gastroesophageal reflux disease)   . Hyperlipidemia   . Hypertension   . Umbilical hernia     PAST SURGICAL HISTOIRY:   Past Surgical History:  Procedure Laterality Date  . salpingooferectomy     Has one ovary & uterus    SOCIAL HISTORY:   Social History   Tobacco Use  . Smoking status: Never Smoker  . Smokeless tobacco: Never Used  Substance Use Topics  . Alcohol use: Never    Frequency: Never    FAMILY HISTORY:   Family History  Problem Relation Age of Onset  . Diabetes Mother   . Pancreatic cancer  Sister     DRUG ALLERGIES:   Allergies  Allergen Reactions  . Metronidazole Other (See Comments)  . Penicillin G Nausea And Vomiting and Other (See Comments)    Has patient had a PCN reaction causing immediate rash, facial/tongue/throat swelling, SOB or lightheadedness with hypotension: Unknown Has patient had a PCN reaction causing severe rash involving mucus membranes or skin necrosis: Unknown Has patient had a PCN reaction that required hospitalization: Unknown Has patient had a PCN reaction occurring within the last 10 years: No If all of the above answers are "NO", then may proceed with Cephalosporin use.     REVIEW OF SYSTEMS:  CONSTITUTIONAL: No fever, fatigue or weakness.  EYES: No blurred or double vision.  EARS, NOSE, AND THROAT: No tinnitus or ear pain.  RESPIRATORY: No cough, shortness of breath, wheezing or hemoptysis.  CARDIOVASCULAR: No chest pain, orthopnea, edema.  GASTROINTESTINAL: No nausea, vomiting, diarrhea or abdominal pain.  GENITOURINARY: No dysuria, hematuria.  ENDOCRINE: No polyuria, nocturia,  HEMATOLOGY: No anemia, easy bruising or bleeding SKIN: No rash or lesion. MUSCULOSKELETAL: No joint pain or arthritis.   NEUROLOGIC: No tingling, numbness, weakness.  PSYCHIATRY: No anxiety or depression.   MEDICATIONS AT HOME:   Prior to Admission medications   Medication Sig Start Date End Date Taking? Authorizing Provider  acetaminophen (TYLENOL) 500 MG tablet Take 1,000 mg by mouth every 6 (six) hours  as needed for moderate pain or headache.   Yes [provider]  atorvastatin (LIPITOR) 80 MG tablet Take 80 mg by mouth at bedtime.   Yes [provider]  BYDUREON BCISE 2 MG/0.85ML AUIJ Take 2 mg by mouth every Saturday.  12/18/17  Yes [provider]  omeprazole (PRILOSEC) 20 MG capsule Take 20 mg by mouth 2 (two) times daily.   Yes [provider]  polyvinyl alcohol (ARTIFICIAL TEARS) 1.4 % ophthalmic solution Place 2  drops into both eyes daily.   Yes [provider]  TRESIBA FLEXTOUCH 200 UNIT/ML SOPN Inject 100 Units into the skin daily.  12/19/17  Yes [provider]      VITAL SIGNS:  Blood pressure 140/83, pulse (!) 109, temperature 98.6 F (37 C), temperature source Oral, resp. rate 17, SpO2 97 %.  PHYSICAL EXAMINATION:  GENERAL:  47 y.o.-year-old patient lying in the bed with no acute distress.  EYES: Pupils equal, round, reactive to light and accommodation. No scleral icterus. Extraocular muscles intact.  HEENT: Head atraumatic, normocephalic. Oropharynx and nasopharynx clear.  NECK:  Supple, no jugular venous distention. No thyroid enlargement, no tenderness.  LUNGS: Normal breath sounds bilaterally, no wheezing, rales,rhonchi or crepitation. No use of accessory muscles of respiration.  CARDIOVASCULAR: S1, S2 normal. No murmurs, rubs, or gallops.  ABDOMEN: Soft, nontender, nondistended. Bowel sounds present. No organomegaly or mass.  EXTREMITIES: No pedal edema, cyanosis, or clubbing.  NEUROLOGIC: Cranial nerves II through XII are intact. Muscle strength 5/5 in all extremities. Sensation intact. Gait not checked.  PSYCHIATRIC: The patient is alert and oriented x 3.  SKIN: No obvious rash, lesion, or ulcer.   LABORATORY PANEL:   CBC Recent Labs  Lab 01/31/18 1103  WBC 15.5*  HGB 13.2  HCT 39.8  PLT 334   ------------------------------------------------------------------------------------------------------------------  Chemistries  Recent Labs  Lab 01/31/18 1103 01/31/18 1114  NA 135  --   K 3.1*  --   CL 98  --   CO2 23  --   GLUCOSE 359*  --   BUN 8  --   CREATININE 0.69  --   CALCIUM 8.6*  --   AST  --  46*  ALT  --  49*  ALKPHOS  --  127*  BILITOT  --  0.7   ------------------------------------------------------------------------------------------------------------------  Cardiac Enzymes Recent Labs  Lab 01/31/18 1103  TROPONINI <0.03    ------------------------------------------------------------------------------------------------------------------  RADIOLOGY:  Ct Angio Chest Pe W And/or Wo Contrast  Result Date: 01/31/2018 CLINICAL DATA:  Chest pain and shortness of breath EXAM: CT ANGIOGRAPHY CHEST WITH CONTRAST TECHNIQUE: Multidetector CT imaging of the chest was performed using the standard protocol during bolus administration of intravenous contrast. Multiplanar CT image reconstructions and MIPs were obtained to evaluate the vascular anatomy. CONTRAST:  75 mL OMNIPAQUE IOHEXOL 350 MG/ML SOLN COMPARISON:  Chest radiograph January 31, 2018 FINDINGS: Cardiovascular: There is no demonstrable pulmonary embolus. There is no thoracic aortic aneurysm or dissection. The visualized great vessels appear normal. There is no pericardial effusion or pericardial thickening. The main pulmonary outflow tract measures 3.6 cm in diameter, prominent. Mediastinum/Nodes: Visualized thyroid appears unremarkable. There is no appreciable thoracic aortic adenopathy. There are no appreciable esophageal lesions. Lungs/Pleura: There are areas of relatively mild patchy atelectasis bilaterally. There is no evident edema or consolidation. No appreciable pleural effusion or pleural thickening. Upper Abdomen: There is somewhat equivocal hepatic steatosis. Visualized upper abdominal structures otherwise appear unremarkable. Musculoskeletal: There is degenerative change in the lower thoracic  spine. There are no blastic or lytic bone lesions. There are no evident chest wall lesions. Review of the MIP images confirms the above findings. IMPRESSION: 1. No demonstrable pulmonary embolus. No thoracic aortic aneurysm or dissection. 2. Prominence the main pulmonary outflow tract, a finding indicative of pulmonary arterial hypertension. 3. Scattered areas of atelectatic change. No lung edema or consolidation. 4.  No evident thoracic adenopathy. 5.  Suspect a degree of  hepatic steatosis. Electronically Signed   By: Bretta Bang III M.D.   On: 01/31/2018 13:54   Dg Chest Port 1 View  Result Date: 01/31/2018 CLINICAL DATA:  Diaphoresis.  Chest pain. EXAM: PORTABLE CHEST 1 VIEW COMPARISON:  None. FINDINGS: The heart size poorly evaluated given portable technique but appears borderline. No pneumothorax. No nodules or masses. No focal infiltrates or overt edema. IMPRESSION: No active disease. Electronically Signed   By: Gerome Sam III M.D   On: 01/31/2018 11:44   US Abdomen Limited Ruq  Result Date: 01/31/2018 CLINICAL DATA:  Cholelithiasis EXAM: ULTRASOUND ABDOMEN LIMITED RIGHT UPPER QUADRANT COMPARISON:  11/09/2017 FINDINGS: Gallbladder: No gallstones or wall thickening visualized. No sonographic Murphy sign noted by sonographer. Common bile duct: Diameter: 4 mm Liver: Increased hepatic echogenicity, consistent with steatosis. Portal vein is patent on color Doppler imaging with normal direction of blood flow towards the liver. IMPRESSION: 1. No cholelithiasis or other evidence of acute cholecystitis. Previously identified material within the gallbladder may have been artifactual, but it is not reproduced today. 2. Hepatic steatosis. Electronically Signed   By: Deatra Robinson M.D.   On: 01/31/2018 13:01    EKG:   Orders placed or performed during the hospital encounter of 01/31/18  . ED EKG within 10 minutes  . ED EKG within 10 minutes   EKG shows sinus tachycardia with 147 bpm and incomplete RBBB no ST-T changes.  No evidence of ischemia. IMPRESSION AND PLAN:   47 year old female patient came in because of sudden chest pain, patient had work-up for chest pain including troponins, EKG, CT angios chest all of them were negative, patient found to have sepsis and UTI. 1.  Sepsis present on admission with evidence of severe tachycardia, elevated lactic acid, elevated WBC abnormal UA.  Continue aggressive IV fluids, follow blood cultures, urine  cultures  Sepsis secondary to UTI: Continue Rocephin, follow urine cultures 3.  Severe tachycardia, chest pain likely partly secondary to anxiety, patient was extremely anxious and according to family it looks like she had panic attack and she was seeing stars: Heart rate is much better and she looks comfortable at this time. 4.  Mild hypokalemia: Replace the potassium. 5.  Hepatic steatosis, evidence family patient is scheduled for gallbladder operation [facility]. #6, diabetes mellitus type 2: Uncontrolled, blood sugar more than 250 when she came. likely due to sepsis .  Continue IV fluids, sliding scale insulin with coverage, check hemoglobin A1c,   all the records are reviewed and case discussed with ED provider. Management plans discussed with the patient, family and they are in agreement.  CODE STATUS: Full code  TOTAL TIME TAKING CARE OF THIS PATIENT: .    Katha Hamming M.D on 01/31/2018 at 3:03 PM  Between 7am to 6pm - Pager - [phone]  After 6pm go to [email or web address] - password [email or web address] [facility]  [facility]  Office  [phone]  CC: Primary care physician; Rosemarie Beath, PA-C  Note: This dictation was prepared with Dragon dictation along with smaller phrase technology. Any  transcriptional errors that result from this process are unintentional.

## 2018-01-31 NOTE — ED Provider Notes (Signed)
[facility] Emergency Department Provider Note ____________________________________________   First MD Initiated Contact with Patient 01/31/18 1106     (approximate)  I have reviewed the triage vital signs and the nursing notes.   HISTORY  Chief Complaint Chest Pain    HPI Lacey Flynn is a 47 y.o. female with PMH as noted below who presents with chest and upper abdominal pain and dizziness, acute onset this morning after the patient was eating breakfast, not associated with nausea or shortness of breath.  Per her son, the patient was eating and then suddenly said that she felt dizzy and was seeing colors.  She then began clutching her chest.  The family immediately brought her to the emergency department.  On arrival she was found to be extremely tachycardic and was brought back to her room.  She denies any prior history of this pain.  She does report history of gallstones and says she was supposed to get surgery but for some reason was told she did not need it.  Past Medical History:  Diagnosis Date  . Cholecystitis   . Diabetes mellitus without complication (HCC)   . GERD (gastroesophageal reflux disease)   . Hyperlipidemia   . Hypertension   . Umbilical hernia     Patient Active Problem List   Diagnosis Date Noted  . Hyperlipidemia due to type 2 diabetes mellitus (HCC) 02/04/2017  . Type 2 diabetes mellitus with diabetic polyneuropathy, with long-term current use of insulin (HCC) 02/04/2017    Past Surgical History:  Procedure Laterality Date  . salpingooferectomy     Has one ovary & uterus    Prior to Admission medications   Medication Sig Start Date End Date Taking? Authorizing Provider  acetaminophen (TYLENOL) 500 MG tablet Take 1,000 mg by mouth every 6 (six) hours as needed for moderate pain or headache.    [provider]  atorvastatin (LIPITOR) 80 MG tablet Take 80 mg by mouth at bedtime.    [provider]    BYDUREON BCISE 2 MG/0.85ML AUIJ Take 2 mg by mouth every Saturday.  12/18/17   [provider]  omeprazole (PRILOSEC) 20 MG capsule Take 20 mg by mouth 2 (two) times daily.    [provider]  polyvinyl alcohol (ARTIFICIAL TEARS) 1.4 % ophthalmic solution Place 2 drops into both eyes daily.    [provider]  TRESIBA FLEXTOUCH 200 UNIT/ML SOPN Inject 100 Units into the skin daily at 12 noon.  12/19/17   [provider]    Allergies Metronidazole and Penicillin g  Family History  Problem Relation Age of Onset  . Diabetes Mother   . Pancreatic cancer Sister     Social History Social History   Tobacco Use  . Smoking status: Never Smoker  . Smokeless tobacco: Never Used  Substance Use Topics  . Alcohol use: Never    Frequency: Never  . Drug use: Never    Review of Systems  Constitutional: No fever. Eyes: No redness. ENT: No sore throat. Cardiovascular: Positive for chest pain. Respiratory: Denies shortness of breath. Gastrointestinal: No vomiting.  Genitourinary: Negative for flank pain.  Musculoskeletal: Negative for back pain. Skin: Negative for rash. Neurological: Negative for headache.  Positive for dizziness.   ____________________________________________   PHYSICAL EXAM:  VITAL SIGNS: ED Triage Vitals  Enc Vitals Group     BP 01/31/18 1100 (!) 129/51     Pulse Rate 01/31/18 1100 (!) 176     Resp 01/31/18 1100 (!)  24     Temp 01/31/18 1100 98.6 F (37 C)     Temp Source 01/31/18 1100 Oral     SpO2 01/31/18 1100 96 %     Weight --      Height --      Head Circumference --      Peak Flow --      Pain Score 01/31/18 1101 10     Pain Loc --      Pain Edu? --      Excl. in GC? --     Constitutional: Alert and oriented.  Extremely uncomfortable appearing. Eyes: Conjunctivae are normal.  Head: Atraumatic. Nose: No congestion/rhinnorhea. Mouth/Throat: Mucous membranes are moist.   Neck: Normal range of motion.   Cardiovascular: Tachycardic, regular rhythm. Grossly normal heart sounds.  Good peripheral circulation. Respiratory: Slightly increased respiratory effort.  No retractions. Lungs CTAB. Gastrointestinal: Soft with mild epigastric tenderness. No distention.  Genitourinary: No flank tenderness. Musculoskeletal: No lower extremity edema.  Extremities warm and well perfused.  Neurologic:  Normal speech and language. No gross focal neurologic deficits are appreciated.  Skin:  Skin is warm and dry. No rash noted. Psychiatric: Anxious appearing.  ____________________________________________   LABS (all labs ordered are listed, but only abnormal results are displayed)  Labs Reviewed  CBC - Abnormal; Notable for the following components:      Result Value   WBC 15.5 (*)    RDW 14.9 (*)    All other components within normal limits  CULTURE, BLOOD (ROUTINE X 2)  CULTURE, BLOOD (ROUTINE X 2)  BASIC METABOLIC PANEL  TROPONIN I  HEPATIC FUNCTION PANEL  LIPASE, BLOOD  PROTIME-INR  LACTIC ACID, PLASMA  LACTIC ACID, PLASMA  URINALYSIS, COMPLETE (UACMP) WITH MICROSCOPIC  POC URINE PREG, ED   ____________________________________________  EKG  ED ECG REPORT I, Dionne Bucy, the attending physician, personally viewed and interpreted this ECG.  Date: 01/31/2018 EKG Time: 1104 Rate: 147 Rhythm: Sinus tachycardia QRS Axis: Left axis Intervals: Incomplete RBBB ST/T Wave abnormalities: normal Narrative Interpretation: Pulmonary disease pattern and tachycardia with no evidence of acute ischemia  ____________________________________________  RADIOLOGY  CXR: No focal infiltrate or other acute abnormality CT chest: No acute PE [place] RUQ: No gallstones  ____________________________________________   PROCEDURES  Procedure(s) performed: No  Procedures  Critical Care performed: Yes  CRITICAL CARE Performed by: Dionne Bucy   Total critical care time: 40  minutes  Critical care time was exclusive of separately billable procedures and treating other patients.  Critical care was necessary to treat or prevent imminent or life-threatening deterioration.  Critical care was time spent personally by me on the following activities: development of treatment plan with patient and/or surrogate as well as nursing, discussions with consultants, evaluation of patient's response to treatment, examination of patient, obtaining history from patient or surrogate, ordering and performing treatments and interventions, ordering and review of laboratory studies, ordering and review of radiographic studies, pulse oximetry and re-evaluation of patient's condition. ____________________________________________   INITIAL IMPRESSION / ASSESSMENT AND PLAN / ED COURSE  Pertinent labs & imaging results that were available during my care of the patient were reviewed by me and considered in my medical decision making (see chart for details).  47 year old female with history of diabetes, hypertension, cholelithiasis, and GERD, with no known cardiac history, presents with acute onset of dizziness and chest and upper abdominal pain with no vomiting or shortness of breath.  I reviewed the past medical records in Epic; the patient has not  had any recent ED visits or admissions.  She was noted to have cholelithiasis on an ultrasound on 11/09/2017 with no evidence of cholecystitis at that time.  On exam, the patient was initially tachycardic to the 170s although this was down to the 130s and sinus within a few minutes.  She is extremely anxious and uncomfortable appearing, clutching her chest but by the time of my exam a few minutes after she arrived was not significantly diaphoretic.  Her abdomen is soft with minimal epigastric tenderness.  Her lungs are clear.  The remainder of the exam is as described above.  Overall I suspect the most likely etiology is biliary colic, acute  cholecystitis, gastritis, or GERD.  I suspect that given the lack of shortness of breath or other changes on the EKG that the tachycardia and chest pain is secondary.  However, given the pulmonary disease pattern on the EKG and the sudden onset, differential also includes PE.  At this time I have no reason to suspect aortic dissection or other vascular etiology.  The pain is not rating to the back.  She has good pulses bilaterally and her blood pressure is normal.  We will obtain labs including sepsis work-up, chest x-ray, give fluids and analgesia.  I will also obtain a CT chest to rule out PE, and a right upper quadrant ultrasound.  ----------------------------------------- 2:39 PM on 01/31/2018 -----------------------------------------  The patient's pain was minimally improved after the initial morphine, so I gave a dose of Dilaudid and she is now more comfortable.  Chest x-ray showed no concerning abnormality and the patient remained fairly tachycardic so I obtained a CT chest which is negative for PE or other acute abnormality to explain the patient's chest pain.  Right upper quadrant ultrasound shows no evidence of gallstones, so this was an incorrect diagnosis previously.  Her work-up is otherwise most consistent with infection/sepsis, with elevated WBC count and lactate.  The UA is also consistent with infection and is likely the source.  I am not sure therefore what the specific etiology of the chest and upper abdominal pain is.  It may be that the patient was having GERD and became near syncopal, or she may be having gastroparesis related to her diabetes.  Given the elevated lactate and the fact that the patient is still borderline tachycardic and still has some chest discomfort, I feel that it would be most appropriate to admit her.  The patient agrees with this plan.  I started ceftriaxone for UTI.  I signed the patient out to the hospitalist Dr. Luberta Mutter.    ____________________________________________   FINAL CLINICAL IMPRESSION(S) / ED DIAGNOSES  Final diagnoses:  Chest pain      NEW MEDICATIONS STARTED DURING THIS VISIT:  New Prescriptions   No medications on file     Note:  This document was prepared using Dragon voice recognition software and may include unintentional dictation errors.    Dionne Bucy, MD 01/31/18 1441

## 2018-01-31 NOTE — ED Triage Notes (Signed)
Pt arrives to ED after staff pulled pt from car. Hx DM. hasn't taken insulin today. Pt is diaphoretic. Speaks spanish. Appears to be uncomfortable. Grasping center chest. Son in room interpreting at this time. CBG 297. Son states started feeling bad after eating. Denies N&V& SOB.

## 2018-02-01 LAB — BASIC METABOLIC PANEL
Anion gap: 10 (ref 5–15)
BUN: 7 mg/dL (ref 6–20)
CO2: 25 mmol/L (ref 22–32)
CREATININE: 0.46 mg/dL (ref 0.44–1.00)
Calcium: 8.3 mg/dL — ABNORMAL LOW (ref 8.9–10.3)
Chloride: 102 mmol/L (ref 98–111)
Glucose, Bld: 279 mg/dL — ABNORMAL HIGH (ref 70–99)
Potassium: 3.8 mmol/L (ref 3.5–5.1)
SODIUM: 137 mmol/L (ref 135–145)

## 2018-02-01 LAB — CBC
HCT: 35.3 % (ref 35.0–47.0)
Hemoglobin: 11.9 g/dL — ABNORMAL LOW (ref 12.0–16.0)
MCH: 29 pg (ref 26.0–34.0)
MCHC: 33.9 g/dL (ref 32.0–36.0)
MCV: 85.8 fL (ref 80.0–100.0)
PLATELETS: 295 10*3/uL (ref 150–440)
RBC: 4.11 MIL/uL (ref 3.80–5.20)
RDW: 14.4 % (ref 11.5–14.5)
WBC: 10.5 10*3/uL (ref 3.6–11.0)

## 2018-02-01 LAB — GLUCOSE, CAPILLARY
GLUCOSE-CAPILLARY: 216 mg/dL — AB (ref 70–99)
GLUCOSE-CAPILLARY: 196 mg/dL — AB (ref 70–99)
GLUCOSE-CAPILLARY: 187 mg/dL — AB (ref 70–99)
GLUCOSE-CAPILLARY: 259 mg/dL — AB (ref 70–99)
Glucose-Capillary: 283 mg/dL — ABNORMAL HIGH (ref 70–99)

## 2018-02-01 LAB — HEMOGLOBIN A1C
Hgb A1c MFr Bld: 11.2 % — ABNORMAL HIGH (ref 4.8–5.6)
Mean Plasma Glucose: 274.74 mg/dL

## 2018-02-01 MED ORDER — INSULIN GLARGINE 100 UNIT/ML ~~LOC~~ SOLN
115.0000 [IU] | Freq: Every day | SUBCUTANEOUS | Status: DC
  Administered 2018-02-01: 115 [IU] via SUBCUTANEOUS
  Filled 2018-02-01 (×2): qty 1.15

## 2018-02-01 MED ORDER — INSULIN ASPART 100 UNIT/ML ~~LOC~~ SOLN
4.0000 [IU] | Freq: Three times a day (TID) | SUBCUTANEOUS | Status: DC
  Administered 2018-02-01 – 2018-02-02 (×3): 4 [IU] via SUBCUTANEOUS
  Filled 2018-02-01 (×3): qty 1

## 2018-02-01 MED ORDER — ALUM & MAG HYDROXIDE-SIMETH 200-200-20 MG/5ML PO SUSP: 30.0000 mL | Freq: Four times a day (QID) | ORAL | Status: DC | PRN

## 2018-02-01 MED ORDER — PANTOPRAZOLE SODIUM 40 MG PO TBEC
40.0000 mg | DELAYED_RELEASE_TABLET | Freq: Two times a day (BID) | ORAL | Status: DC
  Administered 2018-02-01 – 2018-02-02 (×2): 40 mg via ORAL
  Filled 2018-02-01 (×2): qty 1

## 2018-02-01 MED ORDER — ENOXAPARIN SODIUM 40 MG/0.4ML ~~LOC~~ SOLN
40.0000 mg | Freq: Two times a day (BID) | SUBCUTANEOUS | Status: DC
  Administered 2018-02-01 – 2018-02-02 (×2): 40 mg via SUBCUTANEOUS
  Filled 2018-02-01 (×2): qty 0.4

## 2018-02-01 NOTE — Progress Notes (Signed)
[facility] [place] [facility]   PATIENT NAME: Lacey Flynn    MR#:  [id number]  DATE OF BIRTH:  12/26/1970  SUBJECTIVE:  CHIEF COMPLAINT:   Chief Complaint  Patient presents with  . Chest Pain   -Complaints of heartburn and abdominal pain -Still has some dysuria and flank pain REVIEW OF SYSTEMS:  Review of Systems  Constitutional: Negative for chills and fever.  HENT: Negative for hearing loss.   Eyes: Negative for blurred vision and double vision.  Respiratory: Negative for cough, shortness of breath and wheezing.   Cardiovascular: Negative for chest pain and palpitations.  Gastrointestinal: Positive for abdominal pain and heartburn. Negative for constipation, diarrhea, nausea and vomiting.  Genitourinary: Positive for dysuria.  Musculoskeletal: Negative for myalgias.  Neurological: Negative for dizziness, focal weakness, seizures, weakness and headaches.  Psychiatric/Behavioral: Negative for depression.    DRUG ALLERGIES:   Allergies  Allergen Reactions  . Metronidazole Other (See Comments)  . Penicillin G Nausea And Vomiting and Other (See Comments)    Has patient had a PCN reaction causing immediate rash, facial/tongue/throat swelling, SOB or lightheadedness with hypotension: Unknown Has patient had a PCN reaction causing severe rash involving mucus membranes or skin necrosis: Unknown Has patient had a PCN reaction that required hospitalization: Unknown Has patient had a PCN reaction occurring within the last 10 years: No If all of the above answers are "NO", then may proceed with Cephalosporin use.     VITALS:  Blood pressure 125/83, pulse 68, temperature 98.1 F (36.7 C), temperature source Oral, resp. rate 16, weight 133.7 kg, SpO2 97 %.  PHYSICAL EXAMINATION:  Physical Exam  GENERAL:  47 y.o.-year-old obese patient lying in the bed with no acute distress.  EYES: Pupils equal, round, reactive to light and accommodation. No scleral  icterus. Extraocular muscles intact.  HEENT: Head atraumatic, normocephalic. Oropharynx and nasopharynx clear.  NECK:  Supple, no jugular venous distention. No thyroid enlargement, no tenderness.  LUNGS: Normal breath sounds bilaterally, no wheezing, rales,rhonchi or crepitation. No use of accessory muscles of respiration.  CARDIOVASCULAR: S1, S2 normal. No murmurs, rubs, or gallops.  ABDOMEN: Soft, obese abdomen, tenderness in the epigastrium region and also tender to touch flanks radiating to the lower abdomen. Bowel sounds present. No organomegaly or mass.  EXTREMITIES: No pedal edema, cyanosis, or clubbing.  NEUROLOGIC: Cranial nerves II through XII are intact. Muscle strength 5/5 in all extremities. Sensation intact. Gait not checked.  PSYCHIATRIC: The patient is alert and oriented x 3.  SKIN: No obvious rash, lesion, or ulcer.    LABORATORY PANEL:   CBC Recent Labs  Lab 02/01/18 0610  WBC 10.5  HGB 11.9*  HCT 35.3  PLT 295   ------------------------------------------------------------------------------------------------------------------  Chemistries  Recent Labs  Lab 01/31/18 1114 02/01/18 0610  NA  --  137  K  --  3.8  CL  --  102  CO2  --  25  GLUCOSE  --  279*  BUN  --  7  CREATININE  --  0.46  CALCIUM  --  8.3*  AST 46*  --   ALT 49*  --   ALKPHOS 127*  --   BILITOT 0.7  --    ------------------------------------------------------------------------------------------------------------------  Cardiac Enzymes Recent Labs  Lab 01/31/18 1103  TROPONINI <0.03   ------------------------------------------------------------------------------------------------------------------  RADIOLOGY:  Ct Angio Chest Pe W And/or Wo Contrast  Result Date: 01/31/2018 CLINICAL DATA:  Chest pain and shortness of breath EXAM: CT ANGIOGRAPHY CHEST WITH CONTRAST  TECHNIQUE: Multidetector CT imaging of the chest was performed using the standard protocol during bolus administration  of intravenous contrast. Multiplanar CT image reconstructions and MIPs were obtained to evaluate the vascular anatomy. CONTRAST:  75 mL OMNIPAQUE IOHEXOL 350 MG/ML SOLN COMPARISON:  Chest radiograph January 31, 2018 FINDINGS: Cardiovascular: There is no demonstrable pulmonary embolus. There is no thoracic aortic aneurysm or dissection. The visualized great vessels appear normal. There is no pericardial effusion or pericardial thickening. The main pulmonary outflow tract measures 3.6 cm in diameter, prominent. Mediastinum/Nodes: Visualized thyroid appears unremarkable. There is no appreciable thoracic aortic adenopathy. There are no appreciable esophageal lesions. Lungs/Pleura: There are areas of relatively mild patchy atelectasis bilaterally. There is no evident edema or consolidation. No appreciable pleural effusion or pleural thickening. Upper Abdomen: There is somewhat equivocal hepatic steatosis. Visualized upper abdominal structures otherwise appear unremarkable. Musculoskeletal: There is degenerative change in the lower thoracic spine. There are no blastic or lytic bone lesions. There are no evident chest wall lesions. Review of the MIP images confirms the above findings. IMPRESSION: 1. No demonstrable pulmonary embolus. No thoracic aortic aneurysm or dissection. 2. Prominence the main pulmonary outflow tract, a finding indicative of pulmonary arterial hypertension. 3. Scattered areas of atelectatic change. No lung edema or consolidation. 4.  No evident thoracic adenopathy. 5.  Suspect a degree of hepatic steatosis. Electronically Signed   By: Bretta Bang III M.D.   On: 01/31/2018 13:54   Dg Chest Port 1 View  Result Date: 01/31/2018 CLINICAL DATA:  Diaphoresis.  Chest pain. EXAM: PORTABLE CHEST 1 VIEW COMPARISON:  None. FINDINGS: The heart size poorly evaluated given portable technique but appears borderline. No pneumothorax. No nodules or masses. No focal infiltrates or overt edema. IMPRESSION:  No active disease. Electronically Signed   By: Gerome Sam III M.D   On: 01/31/2018 11:44   US Abdomen Limited Ruq  Result Date: 01/31/2018 CLINICAL DATA:  Cholelithiasis EXAM: ULTRASOUND ABDOMEN LIMITED RIGHT UPPER QUADRANT COMPARISON:  11/09/2017 FINDINGS: Gallbladder: No gallstones or wall thickening visualized. No sonographic Murphy sign noted by sonographer. Common bile duct: Diameter: 4 mm Liver: Increased hepatic echogenicity, consistent with steatosis. Portal vein is patent on color Doppler imaging with normal direction of blood flow towards the liver. IMPRESSION: 1. No cholelithiasis or other evidence of acute cholecystitis. Previously identified material within the gallbladder may have been artifactual, but it is not reproduced today. 2. Hepatic steatosis. Electronically Signed   By: Deatra Robinson M.D.   On: 01/31/2018 13:01    EKG:   Orders placed or performed during the hospital encounter of 01/31/18  . ED EKG within 10 minutes  . ED EKG within 10 minutes    ASSESSMENT AND PLAN:   47 year old obese female with past medical history significant for insulin-dependent diabetes mellitus, hypertension, GERD presents to hospital secondary to pleuritic chest pain and also fevers  1.  Acute cystitis-urine cultures are pending -Started on Rocephin.  Continue for now -IV fluids  2.  Epigastric pain-CT angios negative for PE, troponins negative for cardiac pathology -Likely reflux and from GERD -Maalox as needed and also PPI  3.  Insulin-dependent diabetes mellitus-appreciate diabetes coordinator's input.  Strongly encouraged to check fingersticks at home.  Continue Lantus, exenatide in NovoLog for now  4. DVT prophylaxis-Lovenox  Patient is Spanish-speaking.  Her granddaughter was able to translate in the room     All the records are reviewed and case discussed with Care Management/Social Workerr. Management plans discussed with the patient, family  and they are in  agreement.  CODE STATUS: Full Code  TOTAL TIME TAKING CARE OF THIS PATIENT: 38 minutes.   POSSIBLE D/C IN 1 to2 DAYS, DEPENDING ON CLINICAL CONDITION.   Angles Trevizo M.D on 02/01/2018 at 2:54 PM  Between 7am to 6pm - Pager - [phone]  After 6pm go to [email or web address] - Social research officer, government  [facility]  Office  [phone]  CC: Primary care physician; Rosemarie Beath, PA-C

## 2018-02-01 NOTE — Progress Notes (Signed)
Inpatient Diabetes Program Recommendations  AACE/ADA: New Consensus Statement on Inpatient Glycemic Control (2015)  Target Ranges:  Prepandial:   less than 140 mg/dL      Peak postprandial:   less than 180 mg/dL (1-2 hours)      Critically ill patients:  140 - 180 mg/dL   Results for Lacey Flynn, Lacey Flynn (MRN [id number]) as of 02/01/2018 08:51  Ref. Range 01/31/2018 18:06 01/31/2018 20:55 02/01/2018 02:39 02/01/2018 08:07  Glucose-Capillary Latest Ref Range: 70 - 99 mg/dL 272 (H)  5 units NOVOLOG  275 (H)    100 units LANTUS 283 (H) 216 (H)  3 units NOVOLOG    Results for Lacey Flynn, Lacey Flynn (MRN [id number]) as of 02/01/2018 08:51  Ref. Range 01/20/2018 09:14  Hemoglobin A1C Latest Ref Range: 4.8 - 5.6 % 11.2 (H)  Average glucose275 mg/dl    Admit with: Sepsis due to UTI  History: DM  Home DM Meds: Bydureon 2 mg Lacey Flynn       Tresiba U200 (insulin degludec) 100 units Daily  Current Orders: Lantus 100 units Daily      Exenatide 2 mg Q Saturday      Novolog Sensitive Correction Scale/ SSI (0-9 units) TID AC     MD- Please consider the following in-hospital insulin adjustments:  1. Increase Lantus to 110 units QHS  2. Start Novolog Meal Coverage: Novolog 4 units TID with meals  (Please add the following Hold Parameters: Hold if pt eats <50% of meal, Hold if pt NPO)      Endocrinologist: Dr. Mee Hives with [facility] seen in office 07/23/2017.  At that visit, pt was switched to the U200 version of her Tresiba insulin.  Was supposed to have gallbladder surgery in September, however, surgery was cancelled due to elevated A1c level of 11.2%.  Pt was supposed to follow up with her Endocrinologist in regards to her elevated A1c, however, I do not see that she has seen the ENDO since March.  Met with pt this AM.  Family in room (daughter translated for me).  Pt gave me permission to speak with her in front of family.  Patient told me she is only checking CBGs once daily in the  AM.  Never sees readings >180 mg/dl in the AM.  Stated to me the nurses at her MD's office (Dr. Honor Junes) always talk to her about diet when she goes for visits.  Has follow up appt with Dr. Honor Junes on 02/23/2018.  Takes meds as ordered and does not skip doses of meds.  Has CBG meter at home but stated to me that sometimes it reads "error".  I asked pt if she rechecks after the meter reads "error" and she said "yes" but that the meter still reads "error".  I encouraged pt to call the 1-800# toll free number on the back of the meter for assistance if this happens again (they should have assistance in Spanish as well).  If the meter continues to give pt problems, encouraged pt to ask Dr. Honor Junes for a new meter Rx.  Spoke with patient about her current A1c of 11.2%.  Explained what an A1c is and what it measures.  Reminded patient that her goal A1c is 7% or less per ADA standards to prevent both acute and long-term complications.  Explained to patient the extreme importance of good glucose control at home.  Encouraged patient to check her CBGs at least bid at home (fasting and another check within the day) and to record all CBGs  in a logbook for her PCP or Endocrinologist to review.  Also reviewed CBG goals for home.     --Will follow patient during hospitalization--  [name] RN, MSN, CDE Diabetes Coordinator Inpatient Glycemic Control Team Team Pager: [phone] (8a-5p)

## 2018-02-01 NOTE — Discharge Instructions (Signed)
Fingerstick glucose (sugar) goals for home: Before meals: 80-130 mg/dl 2-Hours after meals: less than 180 mg/dl Hemoglobin Z6X goal: 7% or less  Check your sugars 2 times per day at home: 1st thing in the morning and vary the 2nd check of the day (either before Lunch or Dinner OR 2 hours after Breakfast, Lunch, or [employer])

## 2018-02-01 NOTE — Progress Notes (Signed)
Anticoagulation monitoring(Lovenox):  46yo  F ordered Lovenox 40 mg Q24h  Filed Weights   02/01/18 0448  Weight: 294 lb 11.2 oz (133.7 kg)   BMI 53.8   Lab Results  Component Value Date   CREATININE 0.46 02/01/2018   CREATININE 0.69 01/31/2018   CREATININE 0.51 01/21/2018   Estimated Creatinine Clearance: 115.8 mL/min (by C-G formula based on SCr of 0.46 mg/dL). Hemoglobin & Hematocrit     Component Value Date/Time   HGB 11.9 (L) 02/01/2018 0610   HCT 35.3 02/01/2018 0610     Per Protocol for Patient with estCrcl > 30 ml/min and BMI 40, will transition to Lovenox 40 mg Q12h.     Bari Mantis PharmD Clinical Pharmacist 02/01/2018

## 2018-02-02 LAB — BASIC METABOLIC PANEL
Anion gap: 5 (ref 5–15)
BUN: 10 mg/dL (ref 6–20)
CALCIUM: 8.4 mg/dL — AB (ref 8.9–10.3)
CO2: 28 mmol/L (ref 22–32)
CREATININE: 0.49 mg/dL (ref 0.44–1.00)
Chloride: 105 mmol/L (ref 98–111)
Glucose, Bld: 201 mg/dL — ABNORMAL HIGH (ref 70–99)
Potassium: 3.8 mmol/L (ref 3.5–5.1)
Sodium: 138 mmol/L (ref 135–145)

## 2018-02-02 LAB — GLUCOSE, CAPILLARY
GLUCOSE-CAPILLARY: 201 mg/dL — AB (ref 70–99)
Glucose-Capillary: 190 mg/dL — ABNORMAL HIGH (ref 70–99)

## 2018-02-02 LAB — URINE CULTURE

## 2018-02-02 LAB — HIV ANTIBODY (ROUTINE TESTING W REFLEX): HIV Screen 4th Generation wRfx: NONREACTIVE

## 2018-02-02 MED ORDER — LEVOFLOXACIN 500 MG PO TABS: 500.0000 mg | ORAL_TABLET | Freq: Every day | ORAL | 0 refills | Status: AC

## 2018-02-02 NOTE — Care Management Note (Signed)
Case Management Note  Patient Details  Name: Lacey Flynn MRN: [id number] Date of Birth: [birth year]  Subjective/Objective:     Independent in all adls, denies issues accessing medical care, obtaining medications or with transportation.  Current with PCP.  No discharge needs identified at present by care manager or members of care team                 Action/Plan:   Expected Discharge Date:  02/02/18               Expected Discharge Plan:  Home/Self Care  In-House Referral:     Discharge planning Services     Post Acute Care Choice:    Choice offered to:     DME Arranged:    DME Agency:     HH Arranged:    HH Agency:     Status of Service:  Completed, signed off  If discussed at [employer] of Stay Meetings, dates discussed:    Additional Comments:  Sherren Kerns, RN 02/02/2018, 1:32 PM

## 2018-02-02 NOTE — Progress Notes (Signed)
     Lacey Flynn was admitted to the [facility] on 01/31/2018 for an acute medical condition and is being Discharged on  02/02/2018 . Her daughters Amil Moseman and Itzelle Gains have been assisting her during her stay at the hospital. So please kindly excuse them from school on 02/01/18 and 02/02/18    Call Enid Baas  MD, [facility] at  [phone] with questions.  Enid Baas M.D on 02/02/2018,at 12:59 PM  [facility] [address], [place] [address]

## 2018-02-02 NOTE — Progress Notes (Signed)
Patient given discharge instructions with husband at bedside. School notes given to patient for her daughters. IV taken out and tele monitor. Patient verbalized understanding with no further questions or concerns. Patient going home via family vehicle in stable condition.

## 2018-02-02 NOTE — Plan of Care (Signed)
  Problem: Education: Goal: Knowledge of General Education information will improve Description Including pain rating scale, medication(s)/side effects and non-pharmacologic comfort measures 02/02/2018 1514 by Weldon Picking, Manuella Ghazi, RN Outcome: Adequate for Discharge 02/02/2018 1405 by Weldon Picking, Manuella Ghazi, RN Outcome: Adequate for Discharge   Problem: Health Behavior/Discharge Planning: Goal: Ability to manage health-related needs will improve 02/02/2018 1514 by Weldon Picking, Manuella Ghazi, RN Outcome: Adequate for Discharge 02/02/2018 1405 by Weldon Picking, Manuella Ghazi, RN Outcome: Adequate for Discharge   Problem: Clinical Measurements: Goal: Ability to maintain clinical measurements within normal limits will improve 02/02/2018 1514 by Erma Heritage, RN Outcome: Adequate for Discharge 02/02/2018 1405 by Weldon Picking, Manuella Ghazi, RN Outcome: Adequate for Discharge Goal: Will remain free from infection 02/02/2018 1514 by Erma Heritage, RN Outcome: Adequate for Discharge 02/02/2018 1405 by Weldon Picking, Manuella Ghazi, RN Outcome: Adequate for Discharge Goal: Diagnostic test results will improve 02/02/2018 1514 by Erma Heritage, RN Outcome: Adequate for Discharge 02/02/2018 1405 by Weldon Picking, Manuella Ghazi, RN Outcome: Adequate for Discharge Goal: Respiratory complications will improve 02/02/2018 1514 by Erma Heritage, RN Outcome: Adequate for Discharge 02/02/2018 1405 by Weldon Picking, Manuella Ghazi, RN Outcome: Adequate for Discharge Goal: Cardiovascular complication will be avoided 02/02/2018 1514 by Erma Heritage, RN Outcome: Adequate for Discharge 02/02/2018 1405 by Erma Heritage, RN Outcome: Adequate for Discharge   Problem: Activity: Goal: Risk for activity intolerance will decrease 02/02/2018 1514 by Weldon Picking, Manuella Ghazi, RN Outcome: Adequate for Discharge 02/02/2018 1405 by Weldon Picking, Manuella Ghazi, RN Outcome:  Adequate for Discharge   Problem: Nutrition: Goal: Adequate nutrition will be maintained 02/02/2018 1514 by Weldon Picking, Manuella Ghazi, RN Outcome: Adequate for Discharge 02/02/2018 1405 by Weldon Picking, Manuella Ghazi, RN Outcome: Adequate for Discharge   Problem: Coping: Goal: Level of anxiety will decrease 02/02/2018 1514 by Weldon Picking, Manuella Ghazi, RN Outcome: Adequate for Discharge 02/02/2018 1405 by Weldon Picking, Manuella Ghazi, RN Outcome: Adequate for Discharge   Problem: Elimination: Goal: Will not experience complications related to bowel motility 02/02/2018 1514 by Erma Heritage, RN Outcome: Adequate for Discharge 02/02/2018 1405 by Weldon Picking, Manuella Ghazi, RN Outcome: Adequate for Discharge Goal: Will not experience complications related to urinary retention 02/02/2018 1514 by Erma Heritage, RN Outcome: Adequate for Discharge 02/02/2018 1405 by Weldon Picking, Manuella Ghazi, RN Outcome: Adequate for Discharge   Problem: Pain Managment: Goal: General experience of comfort will improve 02/02/2018 1514 by Weldon Picking, Manuella Ghazi, RN Outcome: Adequate for Discharge 02/02/2018 1405 by Weldon Picking, Manuella Ghazi, RN Outcome: Adequate for Discharge   Problem: Safety: Goal: Ability to remain free from injury will improve 02/02/2018 1514 by Weldon Picking, Manuella Ghazi, RN Outcome: Adequate for Discharge 02/02/2018 1405 by Weldon Picking, Manuella Ghazi, RN Outcome: Adequate for Discharge   Problem: Skin Integrity: Goal: Risk for impaired skin integrity will decrease 02/02/2018 1514 by Weldon Picking, Manuella Ghazi, RN Outcome: Adequate for Discharge 02/02/2018 1405 by Weldon Picking, Manuella Ghazi, RN Outcome: Adequate for Discharge   Problem: Fluid Volume: Goal: Hemodynamic stability will improve 02/02/2018 1514 by Erma Heritage, RN Outcome: Adequate for Discharge 02/02/2018 1405 by Weldon Picking, Manuella Ghazi, RN Outcome: Adequate for Discharge   Problem: Clinical  Measurements: Goal: Diagnostic test results will improve 02/02/2018 1514 by Erma Heritage, RN Outcome: Adequate for Discharge 02/02/2018 1405 by Weldon Picking, Manuella Ghazi, RN Outcome: Adequate for Discharge Goal: Signs and symptoms of infection will decrease 02/02/2018 1514 by Erma Heritage, RN Outcome: Adequate for Discharge 02/02/2018 1405 by Erma Heritage, RN Outcome: Adequate for Discharge

## 2018-02-02 NOTE — Plan of Care (Signed)
  Problem: Education: Goal: Knowledge of General Education information will improve Description Including pain rating scale, medication(s)/side effects and non-pharmacologic comfort measures Outcome: Adequate for Discharge   Problem: Health Behavior/Discharge Planning: Goal: Ability to manage health-related needs will improve Outcome: Adequate for Discharge   Problem: Clinical Measurements: Goal: Ability to maintain clinical measurements within normal limits will improve Outcome: Adequate for Discharge Goal: Will remain free from infection Outcome: Adequate for Discharge Goal: Diagnostic test results will improve Outcome: Adequate for Discharge Goal: Respiratory complications will improve Outcome: Adequate for Discharge Goal: Cardiovascular complication will be avoided Outcome: Adequate for Discharge   Problem: Activity: Goal: Risk for activity intolerance will decrease Outcome: Adequate for Discharge   Problem: Nutrition: Goal: Adequate nutrition will be maintained Outcome: Adequate for Discharge   Problem: Coping: Goal: Level of anxiety will decrease Outcome: Adequate for Discharge   Problem: Elimination: Goal: Will not experience complications related to bowel motility Outcome: Adequate for Discharge Goal: Will not experience complications related to urinary retention Outcome: Adequate for Discharge   Problem: Pain Managment: Goal: General experience of comfort will improve Outcome: Adequate for Discharge   Problem: Safety: Goal: Ability to remain free from injury will improve Outcome: Adequate for Discharge   Problem: Skin Integrity: Goal: Risk for impaired skin integrity will decrease Outcome: Adequate for Discharge   Problem: Fluid Volume: Goal: Hemodynamic stability will improve Outcome: Adequate for Discharge   Problem: Clinical Measurements: Goal: Diagnostic test results will improve Outcome: Adequate for Discharge Goal: Signs and symptoms of  infection will decrease Outcome: Adequate for Discharge

## 2018-02-02 NOTE — Progress Notes (Addendum)
Inpatient Diabetes Program Recommendations  AACE/ADA: New Consensus Statement on Inpatient Glycemic Control (2015)  Target Ranges:  Prepandial:   less than 140 mg/dL      Peak postprandial:   less than 180 mg/dL (1-2 hours)      Critically ill patients:  140 - 180 mg/dL   Results for Lacey Flynn, Lacey Flynn (MRN [id number]) as of 02/02/2018 12:22  Ref. Range 02/02/2018 07:47 02/02/2018 11:59  Glucose-Capillary Latest Ref Range: 70 - 99 mg/dL 190 (H) 201 (H)    Admit with: Sepsis due to UTI  History: DM  Home DM Meds: Bydureon 2 mg [name]                             Tresiba U200 (insulin degludec) 100 units Daily  Current Orders: Lantus 115 units Daily                            Novolog Sensitive Correction Scale/ SSI (0-9 units) TID AC      Novolog 4 units TID with meals      Note Lantus increased to 115 units QHS last PM.  Also note Novolog 4 units TID (Meal Coverage) started yesterday at 5pm with dinner.  Still a little elevated today.    Met with patient yesterday to discuss A1c and home DM care regimen.     MD- Please consider the following in-hospital insulin adjustments:  1. Increase Novolog Meal Coverage to: Novolog 6 units TID with meals  2. Patient may need Novolog at home in addition to her Tyler Aas Insulin (insulin degludec) and Byduren (Exenatide) considering her Current A1c of 11.2%.  Was counseled to check her CBGs more often at home.  Has an Endocrinologist and needs to follow up with them. May consider prescribing a Novolog SSI TID AC for home      --Will follow patient during hospitalization--  [name] RN, MSN, CDE Diabetes Coordinator Inpatient Glycemic Control Team Team Pager: [phone] (8a-5p)

## 2018-02-03 NOTE — Discharge Summary (Signed)
[facility] -  at [facility]   PATIENT NAME: Lacey Flynn    MR#:  [id number]  DATE OF BIRTH:  [birth year]  DATE OF ADMISSION:  01/31/2018   ADMITTING PHYSICIAN: Katha Hamming, MD  DATE OF DISCHARGE: 02/02/2018  3:16 PM  PRIMARY CARE PHYSICIAN: Rosemarie Beath, PA-C   ADMISSION DIAGNOSIS:   Cholelithiasis [K80.20] Chest pain [R07.9] Sepsis, due to unspecified organism [A41.9] Urinary tract infection without hematuria, site unspecified [N39.0] Chest pain, unspecified type [R07.9]  DISCHARGE DIAGNOSIS:   Active Problems:   Sepsis (HCC)   SECONDARY DIAGNOSIS:   Past Medical History:  Diagnosis Date  . Cholecystitis   . Diabetes mellitus without complication (HCC)   . GERD (gastroesophageal reflux disease)   . Hyperlipidemia   . Hypertension   . Umbilical hernia     HOSPITAL COURSE:   47 year old obese female with past medical history significant for insulin-dependent diabetes mellitus, hypertension, GERD presents to hospital secondary to pleuritic chest pain and also fevers  1.  Acute cystitis-urine cultures are growing multiple species -was on Rocephin.  Changed to Levaquin at discharge  2.  Epigastric pain-CT angios negative for PE, troponins negative for cardiac pathology -Likely reflux and from GERD -Maalox as needed and also PPI  3.  Insulin-dependent diabetes mellitus-appreciate diabetes coordinator's input.  Strongly encouraged to check fingersticks at home.   - on tresiba and bydureon at home   Patient is Spanish-speaking.  Her daughter in law was able to translate per patients wishes Doing well, up and ambulatory.  Will be discharged home today  DISCHARGE CONDITIONS:   Guarded  CONSULTS OBTAINED:   None  DRUG ALLERGIES:   Allergies  Allergen Reactions  . Metronidazole Other (See Comments)  . Penicillin G Nausea And Vomiting and Other (See Comments)    Has patient had a PCN reaction causing  immediate rash, facial/tongue/throat swelling, SOB or lightheadedness with hypotension: Unknown Has patient had a PCN reaction causing severe rash involving mucus membranes or skin necrosis: Unknown Has patient had a PCN reaction that required hospitalization: Unknown Has patient had a PCN reaction occurring within the last 10 years: No If all of the above answers are "NO", then may proceed with Cephalosporin use.    DISCHARGE MEDICATIONS:   Allergies as of 02/02/2018      Reactions   Metronidazole Other (See Comments)   Penicillin G Nausea And Vomiting, Other (See Comments)   Has patient had a PCN reaction causing immediate rash, facial/tongue/throat swelling, SOB or lightheadedness with hypotension: Unknown Has patient had a PCN reaction causing severe rash involving mucus membranes or skin necrosis: Unknown Has patient had a PCN reaction that required hospitalization: Unknown Has patient had a PCN reaction occurring within the last 10 years: No If all of the above answers are "NO", then may proceed with Cephalosporin use.      Medication List    TAKE these medications   acetaminophen 500 MG tablet Commonly known as:  TYLENOL Take 1,000 mg by mouth every 6 (six) hours as needed for moderate pain or headache.   ARTIFICIAL TEARS 1.4 % ophthalmic solution Generic drug:  polyvinyl alcohol Place 2 drops into both eyes daily.   atorvastatin 80 MG tablet Commonly known as:  LIPITOR Take 80 mg by mouth at bedtime.   BYDUREON BCISE 2 MG/0.85ML Auij Generic drug:  Exenatide ER Take 2 mg by mouth every Saturday.   levofloxacin 500 MG tablet Commonly known as:  LEVAQUIN Take 1 tablet (  500 mg total) by mouth daily.   omeprazole 20 MG capsule Commonly known as:  PRILOSEC Take 20 mg by mouth 2 (two) times daily.   TRESIBA FLEXTOUCH 200 UNIT/ML Sopn Generic drug:  Insulin Degludec Inject 100 Units into the skin daily.        DISCHARGE INSTRUCTIONS:   1.  PCP follow-up in 1  to 2 weeks  DIET:   Cardiac diet  ACTIVITY:   Activity as tolerated  OXYGEN:   Home Oxygen: No.  Oxygen Delivery: room air  DISCHARGE LOCATION:   home   If you experience worsening of your admission symptoms, develop shortness of breath, life threatening emergency, suicidal or homicidal thoughts you must seek medical attention immediately by calling 911 or calling your MD immediately  if symptoms less severe.  You Must read complete instructions/literature along with all the possible adverse reactions/side effects for all the Medicines you take and that have been prescribed to you. Take any new Medicines after you have completely understood and accpet all the possible adverse reactions/side effects.   Please note  You were cared for by a hospitalist during your hospital stay. If you have any questions about your discharge medications or the care you received while you were in the hospital after you are discharged, you can call the unit and asked to speak with the hospitalist on call if the hospitalist that took care of you is not available. Once you are discharged, your primary care physician will handle any further medical issues. Please note that NO REFILLS for any discharge medications will be authorized once you are discharged, as it is imperative that you return to your primary care physician (or establish a relationship with a primary care physician if you do not have one) for your aftercare needs so that they can reassess your need for medications and monitor your lab values.    On the day of Discharge:  VITAL SIGNS:   Blood pressure 120/73, pulse 80, temperature 98 F (36.7 C), temperature source Oral, resp. rate 18, height 5\' 2"  (1.575 m), weight 132.2 kg, SpO2 95 %.  PHYSICAL EXAMINATION:   GENERAL:  47 y.o.-year-old obese patient lying in the bed with no acute distress.  EYES: Pupils equal, round, reactive to light and accommodation. No scleral icterus. Extraocular  muscles intact.  HEENT: Head atraumatic, normocephalic. Oropharynx and nasopharynx clear.  NECK:  Supple, no jugular venous distention. No thyroid enlargement, no tenderness.  LUNGS: Normal breath sounds bilaterally, no wheezing, rales,rhonchi or crepitation. No use of accessory muscles of respiration.  CARDIOVASCULAR: S1, S2 normal. No murmurs, rubs, or gallops.  ABDOMEN: Soft, obese abdomen, tenderness in the epigastrium region and also tender to touch flanks radiating to the lower abdomen. Bowel sounds present. No organomegaly or mass.  EXTREMITIES: No pedal edema, cyanosis, or clubbing.  NEUROLOGIC: Cranial nerves II through XII are intact. Muscle strength 5/5 in all extremities. Sensation intact. Gait not checked.  PSYCHIATRIC: The patient is alert and oriented x 3.  SKIN: No obvious rash, lesion, or ulcer.    DATA REVIEW:   CBC Recent Labs  Lab 02/01/18 0610  WBC 10.5  HGB 11.9*  HCT 35.3  PLT 295    Chemistries  Recent Labs  Lab 01/31/18 1114  02/02/18 0615  NA  --    < > 138  K  --    < > 3.8  CL  --    < > 105  CO2  --    < >  28  GLUCOSE  --    < > 201*  BUN  --    < > 10  CREATININE  --    < > 0.49  CALCIUM  --    < > 8.4*  AST 46*  --   --   ALT 49*  --   --   ALKPHOS 127*  --   --   BILITOT 0.7  --   --    < > = values in this interval not displayed.     Microbiology Results  Results for orders placed or performed during the hospital encounter of 01/31/18  Urine Culture     Status: Abnormal   Collection Time: 01/31/18 12:33 PM  Result Value Ref Range Status   Specimen Description   Final    URINE, RANDOM Performed at [facility], [address] [place], [place] [address]    Special Requests   Final    NONE Performed at [facility], [address] [place], [place] [address]    Culture MULTIPLE SPECIES PRESENT, SUGGEST RECOLLECTION (A)  Final   Report Status 02/02/2018 FINAL  Final  CULTURE, BLOOD (ROUTINE X 2) w Reflex to  ID Panel     Status: None (Preliminary result)   Collection Time: 01/31/18 10:16 PM  Result Value Ref Range Status   Specimen Description BLOOD RIGHT ANTECUBITAL  Final   Special Requests   Final    BOTTLES DRAWN AEROBIC AND ANAEROBIC Blood Culture adequate volume   Culture   Final    NO GROWTH 3 DAYS Performed at [facility], [address], [place], [place] [address]    Report Status PENDING  Incomplete  CULTURE, BLOOD (ROUTINE X 2) w Reflex to ID Panel     Status: None (Preliminary result)   Collection Time: 01/31/18 10:22 PM  Result Value Ref Range Status   Specimen Description BLOOD RIGHT HAND  Final   Special Requests   Final    BOTTLES DRAWN AEROBIC AND ANAEROBIC Blood Culture adequate volume   Culture   Final    NO GROWTH 3 DAYS Performed at [facility], [address] [place], [place] [address]    Report Status PENDING  Incomplete    RADIOLOGY:  No results found.   Management plans discussed with the patient, family and they are in agreement.  CODE STATUS:  Code Status History    Date Active Date Inactive Code Status Order ID Comments User Context   01/31/2018 1449 02/02/2018 1821 Full Code [id number]  Katha Hamming, MD ED      TOTAL TIME TAKING CARE OF THIS PATIENT: 38 minutes.    Enid Baas M.D on 02/03/2018 at 3:44 PM  Between 7am to 6pm - Pager - [phone]  After 6pm go to [email or web address] - password [email or web address] [facility]  [facility] [place] [facility]  Office  [phone]  CC: Primary care physician; Rosemarie Beath, PA-C   Note: This dictation was prepared with Dragon dictation along with smaller phrase technology. Any transcriptional errors that result from this process are unintentional.

## 2018-02-05 LAB — CULTURE, BLOOD (ROUTINE X 2)
CULTURE: NO GROWTH
Culture: NO GROWTH
SPECIAL REQUESTS: ADEQUATE
Special Requests: ADEQUATE

## 2018-02-10 ENCOUNTER — Ambulatory Visit: Payer: PRIVATE HEALTH INSURANCE | Admitting: Gastroenterology

## 2018-02-10 ENCOUNTER — Encounter: Payer: Self-pay | Admitting: Gastroenterology

## 2018-02-10 ENCOUNTER — Ambulatory Visit (INDEPENDENT_AMBULATORY_CARE_PROVIDER_SITE_OTHER): Payer: PRIVATE HEALTH INSURANCE | Admitting: Gastroenterology

## 2018-02-10 VITALS — BP 125/82 | HR 81 | Ht 62.0 in | Wt 290.8 lb

## 2018-02-10 DIAGNOSIS — K581 Irritable bowel syndrome with constipation: Secondary | ICD-10-CM

## 2018-02-10 NOTE — Progress Notes (Signed)
Jonathon Bellows MD, MRCP(U.K) [address]  [place]  [place], [place] [address]  Main: [phone]  Fax: [phone]   Primary Care Physician: Cristy Folks, PA-C  Primary Gastroenterologist:  Dr. Jonathon Bellows   No chief complaint on file.   HPI: Lacey Flynn is a 47 y.o. female    Summary of history :  She was initially referred and seen on 01/25/18 for abdominal pain of 6 years duration , 2-3 times a week , on and off, relieved with a bowel movement , She has seen Dr Rosana Hoes in surgery for the pain . Plan was for a cholecystectomy with umbilical hernia repair, surgery was postponed due to elevated Hba1c.   My impression was that she had IBS-C and commenced her on linzess 145 mcg.   Admitted last week for cystitis and epigastric pain -Ct angio chest was negative. RUQ [place] was normal except for hepatic steatosis. On admission Alk phos was initially a bit elevated and then improved.   Interval history   01/25/2018-  02/10/2018  She says she is doing well, the linzess worked very well initially then stopped.   She does think a higher dose may work.   When the linzess was working the abdominal pain was relieved.    Current Outpatient Medications  Medication Sig Dispense Refill  . acetaminophen (TYLENOL) 500 MG tablet Take 1,000 mg by mouth every 6 (six) hours as needed for moderate pain or headache.    Marland Kitchen atorvastatin (LIPITOR) 80 MG tablet Take 80 mg by mouth at bedtime.    Marland Kitchen BYDUREON BCISE 2 MG/0.85ML AUIJ Take 2 mg by mouth every Saturday.   6  . levofloxacin (LEVAQUIN) 500 MG tablet Take 1 tablet (500 mg total) by mouth daily. 5 tablet 0  . omeprazole (PRILOSEC) 20 MG capsule Take 20 mg by mouth 2 (two) times daily.    . polyvinyl alcohol (ARTIFICIAL TEARS) 1.4 % ophthalmic solution Place 2 drops into both eyes daily.    . TRESIBA FLEXTOUCH 200 UNIT/ML SOPN Inject 100 Units into the skin daily.   11   No current facility-administered medications for this  visit.     Allergies as of 02/10/2018 - Review Complete 01/31/2018  Allergen Reaction Noted  . Metronidazole Other (See Comments) 09/02/2016  . Penicillin g Nausea And Vomiting and Other (See Comments) 09/02/2016    ROS:  General: Negative for anorexia, weight loss, fever, chills, fatigue, weakness. ENT: Negative for hoarseness, difficulty swallowing , nasal congestion. CV: Negative for chest pain, angina, palpitations, dyspnea on exertion, peripheral edema.  Respiratory: Negative for dyspnea at rest, dyspnea on exertion, cough, sputum, wheezing.  GI: See history of present illness. GU:  Negative for dysuria, hematuria, urinary incontinence, urinary frequency, nocturnal urination.  Endo: Negative for unusual weight change.    Physical Examination:   There were no vitals taken for this visit.  General: Well-nourished, well-developed in no acute distress.  Eyes: No icterus. Conjunctivae pink. Mouth: Oropharyngeal mucosa moist and pink , no lesions erythema or exudate. Lungs: Clear to auscultation bilaterally. Non-labored. Heart: Regular rate and rhythm, no murmurs rubs or gallops.  Abdomen: Bowel sounds are normal, nontender, nondistended, no hepatosplenomegaly or masses, no abdominal bruits or hernia , no rebound or guarding.   Extremities: No lower extremity edema. No clubbing or deformities. Neuro: Alert and oriented x 3.  Grossly intact. Skin: Warm and dry, no jaundice.   Psych: Alert and cooperative, normal mood and affect.   Imaging Studies:  Ct Angio Chest Pe W And/or Wo Contrast  Result Date: 01/31/2018 CLINICAL DATA:  Chest pain and shortness of breath EXAM: CT ANGIOGRAPHY CHEST WITH CONTRAST TECHNIQUE: Multidetector CT imaging of the chest was performed using the standard protocol during bolus administration of intravenous contrast. Multiplanar CT image reconstructions and MIPs were obtained to evaluate the vascular anatomy. CONTRAST:  75 mL OMNIPAQUE IOHEXOL 350 MG/ML  SOLN COMPARISON:  Chest radiograph January 31, 2018 FINDINGS: Cardiovascular: There is no demonstrable pulmonary embolus. There is no thoracic aortic aneurysm or dissection. The visualized great vessels appear normal. There is no pericardial effusion or pericardial thickening. The main pulmonary outflow tract measures 3.6 cm in diameter, prominent. Mediastinum/Nodes: Visualized thyroid appears unremarkable. There is no appreciable thoracic aortic adenopathy. There are no appreciable esophageal lesions. Lungs/Pleura: There are areas of relatively mild patchy atelectasis bilaterally. There is no evident edema or consolidation. No appreciable pleural effusion or pleural thickening. Upper Abdomen: There is somewhat equivocal hepatic steatosis. Visualized upper abdominal structures otherwise appear unremarkable. Musculoskeletal: There is degenerative change in the lower thoracic spine. There are no blastic or lytic bone lesions. There are no evident chest wall lesions. Review of the MIP images confirms the above findings. IMPRESSION: 1. No demonstrable pulmonary embolus. No thoracic aortic aneurysm or dissection. 2. Prominence the main pulmonary outflow tract, a finding indicative of pulmonary arterial hypertension. 3. Scattered areas of atelectatic change. No lung edema or consolidation. 4.  No evident thoracic adenopathy. 5.  Suspect a degree of hepatic steatosis. Electronically Signed   By: Lowella Grip III M.D.   On: 01/31/2018 13:54   Dg Chest Port 1 View  Result Date: 01/31/2018 CLINICAL DATA:  Diaphoresis.  Chest pain. EXAM: PORTABLE CHEST 1 VIEW COMPARISON:  None. FINDINGS: The heart size poorly evaluated given portable technique but appears borderline. No pneumothorax. No nodules or masses. No focal infiltrates or overt edema. IMPRESSION: No active disease. Electronically Signed   By: Dorise Bullion III M.D   On: 01/31/2018 11:44   US Abdomen Limited Ruq  Result Date: 01/31/2018 CLINICAL DATA:   Cholelithiasis EXAM: ULTRASOUND ABDOMEN LIMITED RIGHT UPPER QUADRANT COMPARISON:  11/09/2017 FINDINGS: Gallbladder: No gallstones or wall thickening visualized. No sonographic Murphy sign noted by sonographer. Common bile duct: Diameter: 4 mm Liver: Increased hepatic echogenicity, consistent with steatosis. Portal vein is patent on color Doppler imaging with normal direction of blood flow towards the liver. IMPRESSION: 1. No cholelithiasis or other evidence of acute cholecystitis. Previously identified material within the gallbladder may have been artifactual, but it is not reproduced today. 2. Hepatic steatosis. Electronically Signed   By: Ulyses Jarred M.D.   On: 01/31/2018 13:01    Assessment and Plan:   ALANE HANSSEN is a 47 y.o. y/o female here to follow up for IBS-C. Linzess 145 mc initally worked very well with resolution of the pain , after a few days it stopped working with return of contipation and pain .   Plan  1. Increase dose of lizness to 290 mcg - 2 weeks samples given   Entire visit with an in person interpretor.     Dr Jonathon Bellows  MD,MRCP [facility]) Follow up in 4 weeks.

## 2018-02-23 ENCOUNTER — Telehealth: Payer: Self-pay | Admitting: Gastroenterology

## 2018-02-23 NOTE — Telephone Encounter (Signed)
PT is calling to let Dr. Tobi Bastos know the samples did work she was given and she would like prescription send to [facility]

## 2018-02-24 ENCOUNTER — Other Ambulatory Visit: Payer: Self-pay

## 2018-02-24 MED ORDER — LINACLOTIDE 290 MCG PO CAPS: 290.0000 ug | ORAL_CAPSULE | Freq: Every day | ORAL | 0 refills | Status: AC

## 2018-02-24 NOTE — Telephone Encounter (Signed)
Called pt to inform her that her prescription for Linzess has been sent to her preferred pharmacy. LVM

## 2018-03-29 ENCOUNTER — Telehealth: Payer: Self-pay | Admitting: *Deleted

## 2018-03-29 NOTE — Telephone Encounter (Signed)
-----   Message from Martinez LakeMaritza [place]onic[place], [place] sent at 02/24/2018 11:37 AM EDT ----- Regarding: RE: appt and labs Can you please make sure that the patient sees her Endocrinologist [facility] and has a repeat A1C with the medications that were added on her appt on 02/23/2018. If her A1C is less than 9% and we get clearance from her doctor, please schedule a follow up appt with Dr. Everlene FarrierPabon. Thank you.  Follow up: Cholelithiasis  ----- Message ----- From: Adela PortsBoniche, Maritza, CMA Sent: 02/24/2018 To: Adela PortsMaritza Boniche, CMA Subject: appt and labs                                  Make sure that the patient had seen her endocrinologist and had checked an A1C. Then tell Dr. Everlene FarrierPabon so we could schedule her surgery.

## 2018-03-29 NOTE — Telephone Encounter (Signed)
I spoke with the receptionist at Dr. Elberta Fortis'Connell's office today.   The office states patient is scheduled for follow up with Dr. Gershon Crane'Connell on 04-06-18.  Last A1C in October 2019 was 11.2%.  Will need A1C under 9% and okay from Dr. Gershon Crane'Connell before surgery can be rescheduled.

## 2018-03-31 ENCOUNTER — Ambulatory Visit: Payer: PRIVATE HEALTH INSURANCE | Admitting: Gastroenterology

## 2018-04-14 ENCOUNTER — Telehealth: Payer: Self-pay | Admitting: *Deleted

## 2018-04-14 NOTE — Telephone Encounter (Signed)
Patient was contacted today with the help of [facility] spanish interpreter [phone].   The patient missed her appointment with the endocrinologist, [facility] which was scheduled for 04-06-18 at 11:30 am. She reports she missed her appointment because she was getting her passport.  Patient reports that her follow up appointment with their office has been rescheduled for 06-08-2018.  The patient was reminded that she needs to have her A1C under 9% and the okay from Dr. Gershon Crane'Connell so we can get her surgery rescheduled.   Patient was instructed to have Dr. Elberta Fortis'Connell's office fax clearance and call our office so we can get surgery rescheduled.   The patient verbalizes understanding.

## 2018-04-14 NOTE — Telephone Encounter (Signed)
-----   Message from Maritza Boniche, CMA sent at 02/24/2018 11:37 AM EDT ----- Regarding: RE: appt and labs Can you please make sure that the patient sees her Endocrinologist ([facility]) and has a repeat A1C with the medications that were added on her appt on 02/23/2018. If her A1C is less than 9% and we get clearance from her doctor, please schedule a follow up appt with Dr. Pabon. Thank you.  Follow up: Cholelithiasis  ----- Message ----- From: Boniche, Maritza, CMA Sent: 02/24/2018 To: Maritza Boniche, CMA Subject: appt and labs                                  Make sure that the patient had seen her endocrinologist and had checked an A1C. Then tell Dr. Pabon so we could schedule her surgery.  

## 2019-07-26 ENCOUNTER — Ambulatory Visit: Payer: Self-pay | Attending: Oncology

## 2019-07-26 ENCOUNTER — Ambulatory Visit
Admission: RE | Admit: 2019-07-26 | Discharge: 2019-07-26 | Disposition: A | Discharge status: Home / Self Care | Payer: Self-pay | Source: Ambulatory Visit | Attending: Oncology | Admitting: Oncology

## 2019-07-26 ENCOUNTER — Other Ambulatory Visit: Payer: Self-pay

## 2019-07-26 VITALS — BP 134/92 | HR 79 | Temp 95.8°F | Ht 63.0 in | Wt 271.8 lb

## 2019-07-26 DIAGNOSIS — Z0000 Encounter for general adult medical examination without abnormal findings: Secondary | ICD-10-CM | POA: Insufficient documentation

## 2019-07-26 NOTE — Progress Notes (Signed)
  Subjective:     Patient ID: Lacey Flynn, female   DOB: 02-23-1971, 49 y.o.   MRN: [id number]  HPI   Review of Systems     Objective:   Physical Exam Chest:     Breasts:        Right: No swelling, bleeding, inverted nipple, mass, nipple discharge, skin change or tenderness.        Left: No swelling, bleeding, inverted nipple, mass, nipple discharge, skin change or tenderness.        Assessment:     49 year old Hispanic patient presents for [facility] clinic visit.  Delos Haring interpreted exam. Patient screened, and meets [facility]CP eligibility.  Patient does not have insurance, Medicare or Medicaid. Instructed patient on breast self awareness using teach back method.  Clinical breast exam unremarkable. Experiencing perimenopausal symptoms.  Had left oophorectomy and fallopian tube removed in 2008 at [facility].  States last pap at [facility] in 20-19.     Risk Assessment    Risk Scores      07/26/2019   Last edited by: Jim Like, RN   5-year risk: 0.5 %   Lifetime risk: 4.7 %          Plan:     Sent for bilateral screening mammogram. Requested copy of pap results.

## 2019-07-29 ENCOUNTER — Other Ambulatory Visit: Payer: Self-pay

## 2019-07-29 DIAGNOSIS — R920 Mammographic microcalcification found on diagnostic imaging of breast: Secondary | ICD-10-CM

## 2019-07-29 DIAGNOSIS — N63 Unspecified lump in unspecified breast: Secondary | ICD-10-CM

## 2019-08-03 ENCOUNTER — Ambulatory Visit: Payer: Self-pay | Attending: Internal Medicine

## 2019-08-03 DIAGNOSIS — Z23 Encounter for immunization: Secondary | ICD-10-CM

## 2019-08-03 NOTE — Progress Notes (Signed)
   Covid-19 Vaccination Clinic  Name:  Lacey Flynn    MRN: [id number] DOB: 1970/09/07  08/03/2019  [name] was observed post Covid-19 immunization for 15 minutes without incident. She was provided with Vaccine Information Sheet and instruction to access the V-Safe system.   [name] was instructed to call 911 with any severe reactions post vaccine: Marland Kitchen Difficulty breathing  . Swelling of face and throat  . A fast heartbeat  . A bad rash all over body  . Dizziness and weakness   Immunizations Administered    Name Date Dose VIS Date Route   Pfizer COVID-19 Vaccine 08/03/2019  2:32 PM 0.3 mL 04/15/2019 Intramuscular   Manufacturer: [employer], [employer]   Lot: [id number]   NDC: [address]-8

## 2019-08-16 ENCOUNTER — Ambulatory Visit
Admission: RE | Admit: 2019-08-16 | Discharge: 2019-08-16 | Disposition: A | Discharge status: Home / Self Care | Payer: Self-pay | Source: Ambulatory Visit | Attending: Oncology | Admitting: Oncology

## 2019-08-16 DIAGNOSIS — R920 Mammographic microcalcification found on diagnostic imaging of breast: Secondary | ICD-10-CM

## 2019-08-16 DIAGNOSIS — N63 Unspecified lump in unspecified breast: Secondary | ICD-10-CM

## 2019-08-17 ENCOUNTER — Other Ambulatory Visit: Payer: Self-pay

## 2019-08-17 DIAGNOSIS — N63 Unspecified lump in unspecified breast: Secondary | ICD-10-CM

## 2019-08-24 ENCOUNTER — Ambulatory Visit
Admission: RE | Admit: 2019-08-24 | Discharge: 2019-08-24 | Disposition: A | Discharge status: Home / Self Care | Payer: Self-pay | Source: Ambulatory Visit | Attending: Oncology | Admitting: Oncology

## 2019-08-24 DIAGNOSIS — N63 Unspecified lump in unspecified breast: Secondary | ICD-10-CM

## 2019-08-24 HISTORY — PX: BREAST BIOPSY: SHX20

## 2019-08-25 LAB — SURGICAL PATHOLOGY

## 2019-08-26 ENCOUNTER — Other Ambulatory Visit: Payer: Self-pay

## 2019-08-26 DIAGNOSIS — R920 Mammographic microcalcification found on diagnostic imaging of breast: Secondary | ICD-10-CM

## 2019-08-28 ENCOUNTER — Ambulatory Visit: Payer: Self-pay

## 2019-09-01 NOTE — Progress Notes (Addendum)
Per radiologist, patient  Notified of benign biopsy results, but needs surgical referral.  [facility]CP will pay for surgical consult.  Patient called back on 09/13/19.  States she has insurance if surgery recommended.

## 2019-09-13 ENCOUNTER — Encounter: Payer: Self-pay | Admitting: *Deleted

## 2019-09-13 NOTE — Progress Notes (Signed)
Received message from [facility], that patient wanted me to return her call.  Tried to call patient via, Maryjane Hurter the interpreter.  No answer.  Left message to return my call.

## 2019-09-13 NOTE — Progress Notes (Signed)
Patient called Lacey Flynn and requested that I call her back.  Spoke to patient via Lacey Flynn, the interpreter.  We reviewed her breast biopsy results of benign findings and recommendation to follow up with surgical consult.  I have scheduled her to see Dr. Everlene Farrier tomorrow at 9:30.  Informed patient that [facility]CP does not cover surgery and she stated she has insurance.  Encouraged her to give her insurance card to

## 2019-09-14 ENCOUNTER — Encounter: Payer: Self-pay | Admitting: Surgery

## 2019-09-14 ENCOUNTER — Ambulatory Visit (INDEPENDENT_AMBULATORY_CARE_PROVIDER_SITE_OTHER): Payer: BC Managed Care – PPO | Admitting: Surgery

## 2019-09-14 ENCOUNTER — Other Ambulatory Visit: Payer: Self-pay

## 2019-09-14 VITALS — BP 124/82 | HR 72 | Temp 94.8°F | Ht 63.0 in | Wt 277.0 lb

## 2019-09-14 DIAGNOSIS — R921 Mammographic calcification found on diagnostic imaging of breast: Secondary | ICD-10-CM

## 2019-09-14 NOTE — Patient Instructions (Addendum)
Patient will have an repeat Mammogram and Ultrasound in 6 months. Patient will follow up with an office visit with Dr.Pabon in 6 months as well.   Autoexamen de [employer] Breast Self-Awareness El autoexamen de mamas es para Solicitor la apariencia y la sensibilidad de las [place]. Es importante autoexaminarse las [place]. Permite detectar un problema en las mamas a tiempo mientras todava es pequeo y puede tratarse. Todas las mujeres deben autoexaminarse las [place], incluso aquellas que se sometieron a implantes mamarios. Informe al mdico si advierte un cambio en las mamas. Lo que necesita:  Un espejo.  Una habitacin bien iluminada. Cmo realizar el autoexamen de mamas El autoexamen de [place] es una forma de aprender qu es normal para sus mamas y revisar si hay cambios. Para hacer un autoexamen de las mamas: Busque cambios  1. Qutese toda la ropa por encima de la cintura. 2. Prese frente a un espejo en una habitacin con buena iluminacin. 3. Apoye las manos en las caderas. 4. Empuje hacia abajo con las manos. 5. Mrese las mamas y los pezones en el espejo para ver si una mama o un pezn se ve diferente del otro. Durante el examen, intente determinar si: ? La forma de una mama es diferente. ? El tamao de [place] mama es [place]. ? Hay arrugas, depresiones y protuberancias en Deborha Payment y no en la otra. 6. Observe cada mama para buscar cambios en la piel, por ejemplo: ? Enrojecimiento. ? Zonas escamosas. 7. Observe si hay cambios en los pezones, por ejemplo: ? Lquido alrededor [employer]. ? Sangrado. ? Hoyuelos. ? Enrojecimiento. ? Un cambio en el lugar de los pezones. [place] si hay cambios  1. Acustese en el piso boca arriba. 2. Plpese cada mama. Para hacerlo, siga estos pasos: ? Elija una mama para palpar. ? Coloque el brazo ms cercano a esa mama por encima de la cabeza. ? Use el otro brazo para palpar la zona del pezn de la mama. Plpese la zona con las yemas de los tres dedos  del medio y haga crculos con los dedos. Con el primer crculo, presione suavemente. Con el segundo, ms fuerte. Con el tercero, an ms fuerte. ? Siga haciendo crculos con los dedos con las diferentes presiones a medida que desciende por la mama. Detngase cuando sienta las costillas. ? Desplace los dedos un poco hacia el centro del cuerpo. ? Empiece a hacer crculos con los dedos nuevamente y esta vez haga movimientos ascendentes hasta llegar a la clavcula. ? Siga haciendo crculos [place] y Milinda Antis llegar a la axila. Recuerde hacerlos con las tres presiones. ? Plpese la otra mama de la misma forma. 3. Sintese o prese en la ducha o la baera. 4. Con agua jabonosa en la piel, plpese cada mama del mismo modo que lo hizo en el paso2 mientras estaba acostada en el piso. Anote sus hallazgos Anotar lo que encuentra puede ayudarla a recordar qu contarle al mdico. [place] los siguientes datos:  Qu es normal para cada mama.  Cualquier cambio que encuentre en cada mama, por ejemplo: ? La clase de cambios que encuentra. ? Si tiene dolor. ? Si hay bultos, su tamao y [place].  Cundo tuvo su ltimo perodo menstrual. Consejos generales  [employer].  Si est amamantando, el mejor momento para el examen de las mamas es despus de darle de Psychologist, clinical al beb o despus de usar un sacaleches.  Si tiene perodos menstruales, el mejor momento para  hacerlo es de 5 a 7das despus de la finalizado el perodo menstrual.  Con el tiempo, se sentir cmoda con el autoexamen y Medical laboratory scientific officer a saber si hay cambios en sus mamas. Comunquese con un mdico si:  Observa un cambio en la forma o el tamao de las mamas o los pezones.  Observa un cambio en la piel de las mamas o los pezones, como la piel enrojecida o escamosa.  Tiene secrecin de lquido proveniente de los pezones que no es normal.  [place] un ndulo o una zona engrosada que no tena antes.  Tiene  dolor en las [place].  Tiene alguna inquietud [employer] de la [place]. Resumen  El autoexamen de mamas incluye buscar cambios en las [place], y tambin palpar para Hydrographic surveyor cualquier cambio en las [place].  El autoexamen de mamas debe hacerse frente a un espejo en una habitacin bien iluminada.  Debe revisarse las [employer]. Si tiene perodos menstruales, el mejor momento para hacerlo es de 5 a 7das despus de la finalizado el perodo menstrual.  Informe al mdico si ve cambios en las mamas, como cambios en el tamao, cambios en la piel, Social research officer, government o sensibilidad, o un lquido inusual que sale de los pezones. Esta informacin no tiene Marine scientist el consejo del mdico. Asegrese de hacerle al mdico cualquier pregunta que tenga. Document Revised: 01/19/2018 Document Reviewed: 01/19/2018 Elsevier Patient Education  [place].

## 2019-09-14 NOTE — Progress Notes (Signed)
Patient ID: Lacey Flynn, female   DOB: 1971/02/24, 49 y.o.   MRN: [id number]  HPI Lacey Flynn is a 49 y.o. female with a female history of bilateral breast pain.  She reports that she feels intermittent sharp pains on both breast that is mild to moderate intensity there is no specific alleviating or aggravating factors.  She did have ultrasound mammogram that I personally review showing evidence of a cystic lesion in the left side and on the right side there is evidence of calcifications.  Calcifications were biopsied showing no evidence of malignancy or atypia.  He denies any fevers any chills she also denies any nipple discharge or breast masses.  She is not a well controlled diabetic and her BMI is 49.  He does have some dyspnea on exertion.  HPI  Past Medical History:  Diagnosis Date  . Cholecystitis   . Diabetes mellitus without complication (HCC)   . GERD (gastroesophageal reflux disease)   . Hyperlipidemia   . Hypertension   . Umbilical hernia     Past Surgical History:  Procedure Laterality Date  . BREAST BIOPSY Right 08/24/2019   Affirm Bx- Ribbon Clip, Path pending  . salpingooferectomy     Has one ovary & uterus    Family History  Problem Relation Age of Onset  . Diabetes Mother   . Pancreatic cancer Sister     Social History Social History   Tobacco Use  . Smoking status: Never Smoker  . Smokeless tobacco: Never Used  Substance Use Topics  . Alcohol use: Never  . Drug use: Never    Allergies  Allergen Reactions  . Metronidazole Other (See Comments)  . Penicillin G Nausea And Vomiting and Other (See Comments)    Has patient had a PCN reaction causing immediate rash, facial/tongue/throat swelling, SOB or lightheadedness with hypotension: Unknown Has patient had a PCN reaction causing severe rash involving mucus membranes or skin necrosis: Unknown Has patient had a PCN reaction that required hospitalization: Unknown Has patient had a PCN reaction  occurring within the last 10 years: No If all of the above answers are "NO", then may proceed with Cephalosporin use.     Current Outpatient Medications  Medication Sig Dispense Refill  . atorvastatin (LIPITOR) 80 MG tablet Take 80 mg by mouth at bedtime.    . insulin aspart (NOVOLOG) 100 UNIT/ML FlexPen Inject into the skin.    Marland Kitchen JARDIANCE 25 MG TABS tablet Take 12.5 mg by mouth daily.    Marland Kitchen lisinopril (ZESTRIL) 2.5 MG tablet Take by mouth.    . metFORMIN (GLUCOPHAGE-XR) 500 MG 24 hr tablet TAKE 1 TABLET BY MOUTH TWICE A DAY    . omeprazole (PRILOSEC) 20 MG capsule Take 20 mg by mouth 2 (two) times daily.    . TRESIBA FLEXTOUCH 200 UNIT/ML SOPN Inject 100 Units into the skin daily.   11  . acetaminophen (TYLENOL) 500 MG tablet Take 1,000 mg by mouth every 6 (six) hours as needed for moderate pain or headache.    Marland Kitchen BYDUREON BCISE 2 MG/0.85ML AUIJ Take 2 mg by mouth every Saturday.   6  . levofloxacin (LEVAQUIN) 500 MG tablet Take 1 tablet (500 mg total) by mouth daily. (Patient not taking: Reported on 02/10/2018) 5 tablet 0  . linaclotide (LINZESS) 290 MCG CAPS capsule Take 1 capsule (290 mcg total) by mouth daily before breakfast. 30 capsule 0  . polyvinyl alcohol (ARTIFICIAL TEARS) 1.4 % ophthalmic solution Place 2 drops into both eyes  daily.     No current facility-administered medications for this visit.     Review of Systems Full ROS  was asked and was negative except for the information on the HPI  Physical Exam Blood pressure 124/82, pulse 72, temperature (!) 94.8 F (34.9 C), temperature source Temporal, height 5\' 3"  (1.6 m), weight 277 lb (125.6 kg), SpO2 95 %. CONSTITUTIONAL: NAD EYES: Pupils are equal, round, and reactive to light, Sclera are non-icteric. EARS, NOSE, MOUTH AND THROAT: She is wearing a mask hearing is intact to voice. LYMPH NODES:  Lymph nodes in the neck are normal. RESPIRATORY:  Lungs are clear. There is normal respiratory effort, with equal breath sounds  bilaterally, and without pathologic use of accessory muscles. Breast: There is evidence of prior biopsy site on the right.  There is no evidence of infection.  There is no evidence of any palpable lesions on either breast.  There is no evidence of lymphadenopathy. CARDIOVASCULAR: Heart is regular without murmurs, gallops, or rubs. GI: The abdomen is  soft, nontender, and nondistended. There are no palpable masses. There is no hepatosplenomegaly. There are normal bowel sounds in all quadrants. GU: Rectal deferred.   MUSCULOSKELETAL: Normal muscle strength and tone. No cyanosis or edema.   SKIN: Turgor is good and there are no pathologic skin lesions or ulcers. NEUROLOGIC: Motor and sensation is grossly normal. Cranial nerves are grossly intact. PSYCH:  Oriented to person, place and time. Affect is normal.  Data Reviewed  I have personally reviewed the patient's imaging, laboratory findings and medical records.    Assessment/Plan 49 year old female with history of calcification the right side and a cluster of cyst on the left causing mass-effect.   I had a lengthy discussion with the patient about imaging findings.  I also discussed with her the pathology results.  After an extensive counseling the patient agrees with me that she is okay with doing a repeat imaging of the breast bilaterally and clinical exam in 6 months.  Currently there is no need for surgical intervention there is no need for excisional biopsies. Time spent with the patient was 45 minutes, with more than 50% of the time spent in face-to-face education, counseling and care coordination.     Caroleen Hamman, MD FACS General Surgeon 09/14/2019, 2:56 PM

## 2019-09-19 ENCOUNTER — Ambulatory Visit: Payer: Self-pay | Attending: Internal Medicine

## 2019-09-19 DIAGNOSIS — Z23 Encounter for immunization: Secondary | ICD-10-CM

## 2019-09-19 NOTE — Progress Notes (Signed)
   Covid-19 Vaccination Clinic  Name:  TOMESHIA PIZZI    MRN: [id number] DOB: 01/31/1971  09/19/2019  Ms. Nierman was observed post Covid-19 immunization for 30 minutes based on pre-vaccination screening without incident. She was provided with Vaccine Information Sheet and instruction to access the V-Safe system.   Ms. Sydney was instructed to call 911 with any severe reactions post vaccine: Marland Kitchen Difficulty breathing  . Swelling of face and throat  . A fast heartbeat  . A bad rash all over body  . Dizziness and weakness   Immunizations Administered    Name Date Dose VIS Date Route   Pfizer COVID-19 Vaccine 09/19/2019 10:19 AM 0.3 mL 06/29/2018 Intramuscular   Manufacturer: [employer], [employer]   Lot: [id number]   NDC: [address]-9

## 2020-01-27 ENCOUNTER — Other Ambulatory Visit: Payer: Self-pay

## 2020-01-27 DIAGNOSIS — N6320 Unspecified lump in the left breast, unspecified quadrant: Secondary | ICD-10-CM

## 2020-03-20 ENCOUNTER — Ambulatory Visit
Admission: RE | Admit: 2020-03-20 | Discharge: 2020-03-20 | Disposition: A | Discharge status: Home / Self Care | Payer: BC Managed Care – PPO | Source: Ambulatory Visit | Attending: Surgery | Admitting: Surgery

## 2020-03-20 ENCOUNTER — Other Ambulatory Visit: Payer: Self-pay

## 2020-03-20 DIAGNOSIS — N6320 Unspecified lump in the left breast, unspecified quadrant: Secondary | ICD-10-CM | POA: Insufficient documentation

## 2020-03-26 ENCOUNTER — Ambulatory Visit: Payer: BC Managed Care – PPO | Admitting: Surgery

## 2020-03-26 ENCOUNTER — Encounter: Payer: Self-pay | Admitting: Surgery

## 2020-03-26 ENCOUNTER — Other Ambulatory Visit: Payer: Self-pay

## 2020-03-26 VITALS — BP 133/85 | HR 90 | Temp 98.4°F | Ht 63.0 in | Wt 285.8 lb

## 2020-03-26 DIAGNOSIS — N6320 Unspecified lump in the left breast, unspecified quadrant: Secondary | ICD-10-CM

## 2020-03-26 NOTE — Patient Instructions (Addendum)
Patient will have repeat Mammogram and Breast Ultrasound in April 2022. Patient will follow up with Dr.Pabon with office visit after repeating imaging.  Autoexamen de [employer] Breast Self-Awareness El autoexamen de mamas es para Solicitor la apariencia y la sensibilidad de las [place]. Es importante autoexaminarse las [place]. Permite detectar un problema en las mamas a tiempo mientras todava es pequeo y puede tratarse. Todas las mujeres deben autoexaminarse las [place], incluso aquellas que se sometieron a implantes mamarios. Informe al mdico si advierte un cambio en las mamas. Lo que necesita:  Un espejo.  Una habitacin bien iluminada. Cmo realizar el autoexamen de mamas El autoexamen de [place] es una forma de aprender qu es normal para sus mamas y revisar si hay cambios. Para hacer un autoexamen de las mamas: Busque cambios  1. Qutese toda la ropa por encima de la cintura. 2. Prese frente a un espejo en una habitacin con buena iluminacin. 3. Apoye las manos en las caderas. 4. Empuje hacia abajo con las manos. 5. Mrese las mamas y los pezones en el espejo para ver si una mama o un pezn se ve diferente del otro. Durante el examen, intente determinar si: ? La forma de una mama es diferente. ? El tamao de [place] mama es [place]. ? Hay arrugas, depresiones y protuberancias en Deborha Payment y no en la otra. 6. Observe cada mama para buscar cambios en la piel, por ejemplo: ? Enrojecimiento. ? Zonas escamosas. 7. Observe si hay cambios en los pezones, por ejemplo: ? Lquido alrededor [employer]. ? Sangrado. ? Hoyuelos. ? Enrojecimiento. ? Un cambio en el lugar de los pezones. [place] si hay cambios  1. Acustese en el piso boca arriba. 2. Plpese cada mama. Para hacerlo, siga estos pasos: ? Elija una mama para palpar. ? Coloque el brazo ms cercano a esa mama por encima de la cabeza. ? Use el otro brazo para palpar la zona del pezn de la mama. Plpese la zona con las yemas de los tres  dedos del medio y haga crculos con los dedos. Con el primer crculo, presione suavemente. Con el segundo, ms fuerte. Con el tercero, an ms fuerte. ? Siga haciendo crculos con los dedos con las diferentes presiones a medida que desciende por la mama. Detngase cuando sienta las costillas. ? Desplace los dedos un poco hacia el centro del cuerpo. ? Empiece a hacer crculos con los dedos nuevamente y esta vez haga movimientos ascendentes hasta llegar a la clavcula. ? Siga haciendo crculos [place] y Milinda Antis llegar a la axila. Recuerde hacerlos con las tres presiones. ? Plpese la otra mama de la misma forma. 3. Sintese o prese en la ducha o la baera. 4. Con agua jabonosa en la piel, plpese cada mama del mismo modo que lo hizo en el paso2 mientras estaba acostada en el piso. Anote sus hallazgos Anotar lo que encuentra puede ayudarla a recordar qu contarle al mdico. [place] los siguientes datos:  Qu es normal para cada mama.  Cualquier cambio que encuentre en cada mama, por ejemplo: ? La clase de cambios que encuentra. ? Si tiene dolor. ? Si hay bultos, su tamao y [place].  Cundo tuvo su ltimo perodo menstrual. Consejos generales  [employer].  Si est amamantando, el mejor momento para el examen de las mamas es despus de darle de Psychologist, clinical al beb o despus de usar un sacaleches.  Si tiene perodos menstruales, el mejor momento para [place] es de 5  a 7das despus de la finalizado el perodo menstrual.  Con el tiempo, se sentir cmoda con el autoexamen y Games developer a saber si hay cambios en sus mamas. Comunquese con un mdico si:  Observa un cambio en la forma o el tamao de las [address] o los pezones.  Observa un cambio en la piel de las mamas o los pezones, como la piel enrojecida o escamosa.  Tiene secrecin de lquido proveniente de los pezones que no es normal.  [place] un ndulo o una zona engrosada que no tena  antes.  Tiene dolor en las [place].  Tiene alguna inquietud [employer] de la [place]. Resumen  El autoexamen de mamas incluye buscar cambios en las [place], y tambin palpar para Engineer, manufacturing cualquier cambio en las [place].  El autoexamen de mamas debe hacerse frente a un espejo en una habitacin bien iluminada.  Debe revisarse las [employer]. Si tiene perodos menstruales, el mejor momento para hacerlo es de 5 a 7das despus de la finalizado el perodo menstrual.  Informe al mdico si ve cambios en las mamas, como cambios en el tamao, cambios en la piel, Engineer, mining o sensibilidad, o un lquido inusual que sale de los pezones. Esta informacin no tiene Theme park manager el consejo del mdico. Asegrese de hacerle al mdico cualquier pregunta que tenga. Document Revised: 01/19/2018 Document Reviewed: 01/19/2018 Elsevier Patient Education  2020 [employer].

## 2020-03-27 NOTE — Progress Notes (Signed)
Outpatient Surgical Follow Up  03/27/2020  CASSIE SHEDLOCK is an 49 y.o. female.   Chief Complaint  Patient presents with   Follow-up    6 mo fu rec Uni L Diag Mammo [facility] 03/20/20, AM requested interperter    HPI: PALESTINE MOSCO is a 49 y.o. female with a female history of bilateral breast pain.  She reports that she feels intermittent sharp pains on both breast that is mild to moderate intensity there is no specific alleviating or aggravating factors.  She did have ultrasound mammogram that I personally review showing evidence of a clusters of cysts lesion on the left side and on the right side there is evidence of calcifications. Recent mammo and U/S showed no major changes.  SHe denies any fevers any chills she also denies any nipple discharge or breast masses.  She is not a well controlled diabetic and her BMI is 50.6.    Past Medical History:  Diagnosis Date   Cholecystitis    Diabetes mellitus without complication (HCC)    GERD (gastroesophageal reflux disease)    Hyperlipidemia    Hypertension    Umbilical hernia     Past Surgical History:  Procedure Laterality Date   BREAST BIOPSY Right 08/24/2019   Affirm Bx- Ribbon Clip, benign   salpingooferectomy     Has one ovary & uterus    Family History  Problem Relation Age of Onset   Diabetes Mother    Pancreatic cancer Sister     Social History:  reports that she has never smoked. She has never used smokeless tobacco. She reports that she does not drink alcohol and does not use drugs.  Allergies:  Allergies  Allergen Reactions   Metronidazole Other (See Comments)   Penicillin G Nausea And Vomiting and Other (See Comments)    Has patient had a PCN reaction causing immediate rash, facial/tongue/throat swelling, SOB or lightheadedness with hypotension: Unknown Has patient had a PCN reaction causing severe rash involving mucus membranes or skin necrosis: Unknown Has patient had a PCN reaction that required  hospitalization: Unknown Has patient had a PCN reaction occurring within the last 10 years: No If all of the above answers are "NO", then may proceed with Cephalosporin use.     Medications reviewed.    ROS Full ROS performed and is otherwise negative other than what is stated in HPI   BP 133/85    Pulse 90    Temp 98.4 F (36.9 C) (Oral)    Ht 5\' 3"  (1.6 m)    Wt 285 lb 12.8 oz (129.6 kg)    SpO2 93%    BMI 50.63 kg/m   Physical Exam Vitals and nursing note reviewed. Exam conducted with a chaperone present.  Constitutional:      General: She is not in acute distress.    Appearance: Normal appearance. She is obese.  Cardiovascular:     Rate and Rhythm: Normal rate and regular rhythm.     Heart sounds: No murmur heard.   Pulmonary:     Effort: Pulmonary effort is normal. No respiratory distress.     Breath sounds: Normal breath sounds. No stridor. No wheezing or rhonchi.     Comments: Breast: There are no palpable masses.  There are no evidence of any skin or nipple changes Abdominal:     General: There is no distension.     Palpations: Abdomen is soft. There is no mass.     Tenderness: There is no abdominal tenderness.  There is no guarding or rebound.     Hernia: No hernia is present.  Musculoskeletal:        General: No swelling. Normal range of motion.     Cervical back: Normal range of motion and neck supple. No rigidity or tenderness.  Lymphadenopathy:     Cervical: No cervical adenopathy.  Skin:    General: Skin is warm and dry.     Capillary Refill: Capillary refill takes less than 2 seconds.  Neurological:     General: No focal deficit present.     Mental Status: She is alert and oriented to person, place, and time.  Psychiatric:        Mood and Affect: Mood normal.        Behavior: Behavior normal.        Thought Content: Thought content normal.        Judgment: Judgment normal.        Assessment/Plan: 49 year old with a BMI of 50 on bilateral  mastalgia with an associated left breast cystic lesion that is a stable.  She does have benign calcifications on the right side.  We will see her back with bilateral mammograms and left breast ultrasound in about 5 months.  There is no need for any biopsies or any other interventions at this time.   Greater than 50% of the 25 minutes  visit was spent in counseling/coordination of care   Sterling Big, MD [facility] General Surgeon

## 2020-08-20 ENCOUNTER — Ambulatory Visit
Admission: RE | Admit: 2020-08-20 | Discharge: 2020-08-20 | Disposition: A | Discharge status: Home / Self Care | Payer: BC Managed Care – PPO | Source: Ambulatory Visit | Attending: Surgery | Admitting: Surgery

## 2020-08-20 ENCOUNTER — Other Ambulatory Visit: Payer: Self-pay

## 2020-08-20 ENCOUNTER — Other Ambulatory Visit: Payer: Self-pay | Admitting: Surgery

## 2020-08-20 DIAGNOSIS — N6320 Unspecified lump in the left breast, unspecified quadrant: Secondary | ICD-10-CM

## 2020-08-21 NOTE — Progress Notes (Signed)
08/21/20  Mammogram and ultrasound images personally viewed and agree with findings.  Called patient to discuss results.  She has an appointment with Dr. Everlene Farrier on 4/27.  All questions answered.  Henrene Dodge, MD

## 2020-08-29 ENCOUNTER — Other Ambulatory Visit: Payer: Self-pay

## 2020-08-29 ENCOUNTER — Ambulatory Visit: Payer: BC Managed Care – PPO | Admitting: Surgery

## 2020-08-29 ENCOUNTER — Encounter: Payer: Self-pay | Admitting: Surgery

## 2020-08-29 VITALS — BP 150/81 | HR 76 | Temp 98.3°F | Ht 63.0 in | Wt 297.0 lb

## 2020-08-29 DIAGNOSIS — R921 Mammographic calcification found on diagnostic imaging of breast: Secondary | ICD-10-CM

## 2020-08-29 NOTE — Patient Instructions (Addendum)
The patient has been asked to return to the office in one year with a bilateral diagnostic mammogram.   Autoexamen de mamas Breast Self-Awareness El autoexamen de mamas es para Solicitor la apariencia y la sensibilidad de las [place]. Es importante autoexaminarse las [place]. Permite detectar un problema en las mamas a tiempo mientras todava es pequeo y puede tratarse. Todas las mujeres deben autoexaminarse las [place], incluso aquellas que se sometieron a implantes mamarios. Informe al mdico si advierte un cambio en las mamas. Lo que necesita:  Un espejo.  Una habitacin bien iluminada. Cmo realizar el autoexamen de mamas El autoexamen de [place] es una forma de aprender qu es normal para sus mamas y revisar si hay cambios. Para hacer un autoexamen de las mamas: Busque cambios 1. Qutese toda la ropa por encima de la cintura. 2. Prese frente a un espejo en una habitacin con buena iluminacin. 3. Apoye las manos en las caderas. 4. Empuje hacia abajo con las manos. 5. Mrese las mamas y los pezones en el espejo para ver si una mama o un pezn se ve diferente del otro. Durante el examen, intente determinar si: ? La forma de una mama es diferente. ? El tamao de [place] mama es [place]. ? Hay arrugas, depresiones y protuberancias en Deborha Payment y no en la otra. 6. Observe cada mama para buscar cambios en la piel, por ejemplo: ? Enrojecimiento. ? Zonas escamosas. 7. Observe si hay cambios en los pezones, por ejemplo: ? Lquido alrededor [employer]. ? Sangrado. ? Hoyuelos. ? Enrojecimiento. ? Un cambio en el lugar de los pezones.   [place] si hay cambios 1. Acustese en el piso boca arriba. 2. Plpese cada mama. Para hacerlo, siga estos pasos: ? Elija una mama para palpar. ? Coloque el brazo ms cercano a esa mama por encima de la cabeza. ? Use el otro brazo para palpar la zona del pezn de la mama. Plpese la zona con las yemas de los tres dedos del medio y haga crculos con los dedos. Con  el primer crculo, presione suavemente. Con el segundo, ms fuerte. Con el tercero, an ms fuerte. ? Siga haciendo crculos con los dedos con las diferentes presiones a medida que desciende por la mama. Detngase cuando sienta las costillas. ? Desplace los dedos un poco hacia el centro del cuerpo. ? Empiece a hacer crculos con los dedos nuevamente y esta vez haga movimientos ascendentes hasta llegar a la clavcula. ? Siga haciendo crculos [place] y Milinda Antis llegar a la axila. Recuerde hacerlos con las tres presiones. ? Plpese la otra mama de la misma forma. 3. Sintese o prese en la ducha o la baera. 4. Con agua jabonosa en la piel, plpese cada mama del mismo modo que lo hizo en el paso2 mientras estaba acostada en el piso.   Anote sus hallazgos Anotar lo que encuentra puede ayudarla a recordar qu contarle al mdico. [place] los siguientes datos:  Qu es normal para cada mama.  Cualquier cambio que encuentre en cada mama, por ejemplo: ? La clase de cambios que encuentra. ? Si tiene dolor. ? Si hay bultos, su tamao y [place].  Cundo tuvo su ltimo perodo menstrual. Consejos generales  [employer].  Si est amamantando, el mejor momento para el examen de las mamas es despus de darle de Psychologist, clinical al beb o despus de usar un sacaleches.  Si tiene perodos menstruales, el mejor momento para hacerlo es de 5 a 7das  despus de la finalizado el perodo menstrual.  Con el tiempo, se sentir cmoda con el autoexamen y Games developer a saber si hay cambios en sus mamas. Comunquese con un mdico si:  Observa un cambio en la forma o el tamao de las [id number] o los pezones.  Observa un cambio en la piel de las mamas o los pezones, como la piel enrojecida o escamosa.  Tiene secrecin de lquido proveniente de los pezones que no es normal.  [place] un ndulo o una zona engrosada que no tena antes.  Tiene dolor en las [place].  Tiene alguna  inquietud [employer] de la [place]. Resumen  El autoexamen de mamas incluye buscar cambios en las [place], y tambin palpar para Engineer, manufacturing cualquier cambio en las [place].  El autoexamen de mamas debe hacerse frente a un espejo en una habitacin bien iluminada.  Debe revisarse las [employer]. Si tiene perodos menstruales, el mejor momento para hacerlo es de 5 a 7das despus de la finalizado el perodo menstrual.  Informe al mdico si ve cambios en las mamas, como cambios en el tamao, cambios en la piel, Engineer, mining o sensibilidad, o un lquido inusual que sale de los pezones. Esta informacin no tiene Theme park manager el consejo del mdico. Asegrese de hacerle al mdico cualquier pregunta que tenga. Document Revised: 01/19/2018 Document Reviewed: 01/19/2018 Elsevier Patient Education  2021 [employer].

## 2020-08-31 ENCOUNTER — Encounter: Payer: Self-pay | Admitting: Surgery

## 2020-08-31 NOTE — Progress Notes (Signed)
Outpatient Surgical Follow Up  08/31/2020  Lacey Flynn is an 50 y.o. female.   Chief Complaint  Patient presents with  . Follow-up    HPI: Lacey Flynn a 50 y.o.femalewith a female history of bilateral breast pain.   Her mastalgia has improved some but she does reports some intermittent mastalgia. Sharp pains, mild moderate in intensity. She did have ultrasound mammogram that I personally reviewed showing evidence of a clusters of cysts lesion on the left side and on the right side there is evidence of calcifications. Recent mammo and U/S showed no major changes. SHe denies any fevers any chills she also denies any nipple discharge or breast masses. She is not a well controlled diabetic and her BMI is 52.6  Past Medical History:  Diagnosis Date  . Cholecystitis   . Diabetes mellitus without complication (HCC)   . GERD (gastroesophageal reflux disease)   . Hyperlipidemia   . Hypertension   . Umbilical hernia     Past Surgical History:  Procedure Laterality Date  . BREAST BIOPSY Right 08/24/2019   Affirm Bx- Ribbon Clip, benign  . salpingooferectomy     Has one ovary & uterus    Family History  Problem Relation Age of Onset  . Diabetes Mother   . Pancreatic cancer Sister     Social History:  reports that she has never smoked. She has never used smokeless tobacco. She reports that she does not drink alcohol and does not use drugs.  Allergies:  Allergies  Allergen Reactions  . Metronidazole Other (See Comments)  . Penicillin G Nausea And Vomiting and Other (See Comments)    Has patient had a PCN reaction causing immediate rash, facial/tongue/throat swelling, SOB or lightheadedness with hypotension: Unknown Has patient had a PCN reaction causing severe rash involving mucus membranes or skin necrosis: Unknown Has patient had a PCN reaction that required hospitalization: Unknown Has patient had a PCN reaction occurring within the last 10 years: No If all of the  above answers are "NO", then may proceed with Cephalosporin use.     Medications reviewed.    ROS Full ROS performed and is otherwise negative other than what is stated in HPI   BP (!) 150/81   Pulse 76   Temp 98.3 F (36.8 C)   Ht 5\' 3"  (1.6 m)   Wt 297 lb (134.7 kg)   SpO2 95%   BMI 52.61 kg/m   Physical Exam  Physical Exam Vitals and nursing note reviewed. Exam conducted with a chaperone present.  Constitutional:      General: She is not in acute distress.    Appearance: Normal appearance. She is obese.  Cardiovascular:     Rate and Rhythm: Normal rate and regular rhythm.     Heart sounds: No murmur heard.   Pulmonary:     Effort: Pulmonary effort is normal. No respiratory distress.     Breath sounds: Normal breath sounds. No stridor. No wheezing or rhonchi.     Comments: Breast: There are no palpable masses. Mild tenderness to plaption bilaterally  There are no evidence of any skin or nipple changes. No LAD Abdominal:     General: There is no distension.     Palpations: Abdomen is soft. There is no mass.     Tenderness: There is no abdominal tenderness. There is no guarding or rebound.     Hernia: No hernia is present.  Musculoskeletal:        General: No swelling. Normal  range of motion.     Cervical back: Normal range of motion and neck supple. No rigidity or tenderness.  Lymphadenopathy:     Cervical: No cervical adenopathy.  Skin:    General: Skin is warm and dry.     Capillary Refill: Capillary refill takes less than 2 seconds.  Neurological:     General: No focal deficit present.     Mental Status: She is alert and oriented to person, place, and time.  Psychiatric:        Mood and Affect: Mood normal.        Behavior: Behavior normal.        Thought Content: Thought content normal.        Judgment: Judgment normal.       Assessment/Plan: Bilateral mastalgia of unknown etiology.  Fibrocystic disease in the left breast.  No evidence of any  suspicious lesions married further biopsies personally prevention.  Recommend yearly follow-up with mammogram.  Also recommend lifestyle changes and weight optimization.  Greater than 50% of the 30 minutes  visit was spent in counseling/coordination of care   Sterling Big, MD [facility] General Surgeon

## 2021-07-15 ENCOUNTER — Other Ambulatory Visit: Payer: Self-pay

## 2021-07-15 DIAGNOSIS — N6322 Unspecified lump in the left breast, upper inner quadrant: Secondary | ICD-10-CM

## 2021-08-21 ENCOUNTER — Other Ambulatory Visit: Payer: BC Managed Care – PPO

## 2021-08-21 ENCOUNTER — Inpatient Hospital Stay: Admission: RE | Admit: 2021-08-21 | Payer: BC Managed Care – PPO | Source: Ambulatory Visit

## 2021-08-28 ENCOUNTER — Ambulatory Visit: Payer: BC Managed Care – PPO | Admitting: Surgery

## 2021-09-09 ENCOUNTER — Ambulatory Visit
Admission: RE | Admit: 2021-09-09 | Discharge: 2021-09-09 | Disposition: A | Discharge status: Home / Self Care | Payer: BC Managed Care – PPO | Source: Ambulatory Visit | Attending: Surgery | Admitting: Surgery

## 2021-09-09 DIAGNOSIS — N6322 Unspecified lump in the left breast, upper inner quadrant: Secondary | ICD-10-CM | POA: Insufficient documentation

## 2021-09-11 ENCOUNTER — Encounter: Payer: Self-pay | Admitting: Surgery

## 2021-09-11 ENCOUNTER — Ambulatory Visit (INDEPENDENT_AMBULATORY_CARE_PROVIDER_SITE_OTHER): Payer: BC Managed Care – PPO | Admitting: Surgery

## 2021-09-11 VITALS — BP 121/81 | HR 89 | Temp 98.4°F | Wt 280.6 lb

## 2021-09-11 DIAGNOSIS — N644 Mastodynia: Secondary | ICD-10-CM | POA: Diagnosis not present

## 2021-09-11 NOTE — Progress Notes (Signed)
Outpatient Surgical Follow Up ? ?09/11/2021 ? ?Lacey Flynn is an 51 y.o. female.  ? ?Chief Complaint  ?Patient presents with  ? Follow-up  ?  1 year f/u bil mammo 09/09/21  ? ? ?HPI: 51 year old female with history of bilateral breast pain and evidence of calcifications on the left side and cystic lesion on the Right.  She did have a biopsy in the left side that were benign.  She is diabetic.  She comes in for yearly exams and review of mammogram.  I have personally reviewed the mammogram showing no evidence of new masses or suspicious lesions that warrant any further interventions.  She does have some abnormal uterine bleeding that is being followed by GYN at [facility]. ?From breast perspective she endorses no major complaints or some mild discomfort.  Denies any palpable masses and denies any skin changes or nipple discharge. ? ?Past Medical History:  ?Diagnosis Date  ? Cholecystitis   ? Diabetes mellitus without complication ([place])   ? GERD (gastroesophageal reflux disease)   ? Hyperlipidemia   ? Hypertension   ? Umbilical hernia   ? ? ?Past Surgical History:  ?Procedure Laterality Date  ? BREAST BIOPSY Right 08/24/2019  ? Affirm Bx- Ribbon Clip, benign  ? salpingooferectomy    ? Has one ovary & uterus  ? ? ?Family History  ?Problem Relation Age of Onset  ? Diabetes Mother   ? Pancreatic cancer Sister   ? ? ?Social History:  reports that she has never smoked. She has never used smokeless tobacco. She reports that she does not drink alcohol and does not use drugs. ? ?Allergies:  ?Allergies  ?Allergen Reactions  ? Metronidazole Other (See Comments)  ? Penicillin G Nausea And Vomiting and Other (See Comments)  ?  Has patient had a PCN reaction causing immediate rash, facial/tongue/throat swelling, SOB or lightheadedness with hypotension: Unknown ?Has patient had a PCN reaction causing severe rash involving mucus membranes or skin necrosis: Unknown ?Has patient had a PCN reaction that required hospitalization: Unknown ?Has  patient had a PCN reaction occurring within the last 10 years: No ?If all of the above answers are "NO", then may proceed with Cephalosporin use. ?  ? ? ?Medications reviewed. ? ? ? ?ROS ?Full ROS performed and is otherwise negative other than what is stated in HPI ? ? ?BP 121/81   Pulse 89   Temp 98.4 ?F (36.9 ?C) (Oral)   Wt 280 lb 9.6 oz (127.3 kg)   SpO2 95%   BMI 49.71 kg/m?  ? ?Physical Exam ?Vitals and nursing note reviewed. Exam conducted with a chaperone present.  ?Constitutional:   ?   General: She is not in acute distress. ?   Appearance: Normal appearance. She is obese.  ?Cardiovascular:  ?   Rate and Rhythm: Normal rate and regular rhythm.  ?   Heart sounds: No murmur heard.  ? ?Pulmonary:  ?   Effort: Pulmonary effort is normal. No respiratory distress.  ?   Breath sounds: Normal breath sounds. No stridor. No wheezing or rhonchi.  ?   Comments: Breast: There are no palpable masses. Mild tenderness to palpation bilaterally  There are no evidence of any skin or nipple changes. No LAD ?Abdominal:  ?   General: There is no distension.  ?   Palpations: Abdomen is soft. There is no mass.  ?   Tenderness: There is no abdominal tenderness. There is no guarding or rebound.  ?   Hernia: No hernia is  present.  ?Musculoskeletal:     ?   General: No swelling. Normal range of motion.  ?   Cervical back: Normal range of motion and neck supple. No rigidity or tenderness.  ?Lymphadenopathy:  ?   Cervical: No cervical adenopathy.  ?Skin: ?   General: Skin is warm and dry.  ?   Capillary Refill: Capillary refill takes less than 2 seconds.  ?Neurological:  ?   General: No focal deficit present.  ?   Mental Status: She is alert and oriented to person, place, and time.  ?Psychiatric:     ?   Mood and Affect: Mood normal.     ?   Behavior: Behavior normal.     ?   Thought Content: Thought content normal.     ?   Judgment: Judgment normal.  ?  ?Assessment/Plan: ?Chronic mild bilateral breast tenderness..  No evidence of  any suspicious lesions on mammogram or physical exam.  Recommend yearly follow-up with mammogram.    ?Time spent in the encounter was 20 minutes, with more than 50% of the time spent in face-to-face education, counseling and care coordination.   ? ?Caroleen Hamman, MD FACS ?General Surgeon  ?

## 2021-09-11 NOTE — Patient Instructions (Signed)
Autoexamen de mamas ?Breast Self-Awareness ?El autoexamen de mamas es para Geologist, engineering apariencia y la sensibilidad de las [place]. Es necesario que respete estas indicaciones: ?Controle sus mamas peri?dicamente. ?Informe al m?dico si hay alg?n cambio. ?Familiar?cese con el aspecto y con la forma en que se sienten sus mamas al palparlas. Esto puede ayudarle a Nurse, children's en las mamas mientras todav?a es peque?o y puede tratarse. Debe hacerse un autoexamen de mamas incluso si tiene implantes mamarios. ?Lo que necesita: ?Un espejo. ?Una habitaci?n bien iluminada. ?Una almohada u otro objeto blando. ?C?mo realizar el autoexamen de mamas ?Siga estos pasos para realizar un autoexamen de mamas: ?Busque cambios ? ?Qu?tese toda la ropa por encima de la cintura. ?P?rese frente a un espejo en una habitaci?n con buena iluminaci?n. ?Coloque las manos a los costados. ?Compare las mamas en el espejo. Busque diferencias entre ellas, por ejemplo: ?Una diferencia en la forma. ?Una diferencia en el tama?o. ?Arrugas, depresiones y protuberancias en Deborha Payment y no en la otra. ?Observe cada mama para buscar cambios en la piel, por ejemplo: ?Enrojecimiento. ?Zonas escamosas. ?Piel que se ha engrosado. ?Hoyuelos. ?Llagas abiertas (?lceras). ?Observe si hay cambios en los pezones, por ejemplo: ?L?quido que sale de un pez?n. ?L?quido alrededor de un pez?n. ?Sangrado. ?Hoyuelos. ?Enrojecimiento. ?Un pez?n que parece metido hacia adentro (retra?do) o que ha cambiado de posici?n. ?[place] si hay cambios ?Acu?stese boca arriba. ?P?lpese cada mama. Para hacer esto: ?Elija una mama para palpar. ?Coloque una almohada debajo del hombro m?s cercano a esa mama. Coloque el brazo m?s cercano a esa mama por detr?s de la cabeza. ?P?lpese la zona del pez?n de esa mama con la mano del otro brazo. P?lpese la zona con las yemas de los tres dedos del medio y haga c?rculos con los dedos. Utilice una presi?n ligera, media y [employer]. ?Contin?e  superponiendo c?rculos, movi?ndose hacia abajo por encima de la mama. Contin?e haciendo c?rculos con los dedos. Det?ngase cuando sienta las costillas. ?Empiece a hacer c?rculos con los dedos nuevamente, esta vez haciendo movimientos ascendentes hasta llegar a la clav?cula. ?Luego, haga c?rculos hacia afuera, de un lado a otro de la mama y [employer] zona de la axila. ?Apriete el pez?n. Compruebe si hay secreci?n y bultos. ?Repita estos pasos para examinar la otra mama. ?Si?ntese o p?rese en la ducha o la ba?era. ?Con agua jabonosa en la piel, p?lpese cada mama del mismo modo que lo hizo [place]. ?Anote sus hallazgos ?Anotar lo que encuentra puede ayudarla a recordar qu? contarle al m?dico. Escriba los siguientes datos: ?Qu? es normal para cada mama. ?Cualquier cambio que haya encontrado en cada mama. Estos incluyen: ?La clase de cambios que encuentra. ?Dolor o sensibilidad en la mama. ?Cualquier bulto que encuentre. Anote el tama?o y d?nde se encuentra. ?Cu?ndo tuvo su ?ltimo per?odo (ciclo menstrual). ?Consejos generales ?Si est? amamantando, el mejor momento para el examen de las mamas es despu?s de darle de Psychologist, clinical al beb? o despu?s de Manufacturing engineer. ?Si tiene [employer], el mejor momento para examinarse las [place] es de 5 a 7 d?as despu?s de finalizado su ciclo mensual. ?Con el tiempo, se sentir? c?moda con el autoexamen. Tambi?n comenzar? a saber si hay cambios en sus mamas. ?Comun?quese con un m?dico si: ?Observa un cambio en la forma o el tama?o de las mamas o los pezones. ?Observa un cambio en la piel de las mamas o los pezones, como la piel enrojecida o escamosa. ?Tiene secreci?n de l?quido proveniente de  los pezones que no es normal. ?[place] un nuevo bulto o una zona engrosada. ?Tiene dolor de [place]. ?Tiene alguna inquietud [employer] de la [place]. ?Resumen ?El autoexamen de mamas incluye buscar cambios en las mamas y palpar para Engineer, manufacturing cualquier cambio en las [place]. ?Debe  hacer el autoexamen de mamas frente a un espejo en una habitaci?n bien iluminada. ?Si tiene per?odos (ciclos menstruales), el mejor momento para examinarse las mamas es de 5 a 7 d?as despu?s de finalizado el per?odo menstrual. ?Informe al m?dico cualquier cambio que vea en sus mamas. Los cambios incluyen cambios en el tama?o, cambios en la piel, Engineer, mining o sensibilidad, o l?quido que sale de los pezones que no es normal. ?Esta informaci?n no tiene Theme park manager el consejo del m?dico. Aseg?rese de hacerle al m?dico cualquier pregunta que tenga. ?Document Revised: 03/13/2021 Document Reviewed: 03/13/2021 ?Elsevier Patient Education ? 2023 Elsevier Inc. ? ?

## 2021-09-12 ENCOUNTER — Encounter: Payer: Self-pay | Admitting: Surgery

## 2021-12-17 IMAGING — US US BREAST*L* LIMITED INC AXILLA
1 series · 5 of 5 positions shown · non-contrast
Comparison: Previous exam(s).
COMPARISON: Previous exam(s).

Addendum:
CLINICAL DATA: The patient was called back for right breast
calcifications and a left breast mass.

EXAM:
DIGITAL DIAGNOSTIC BILATERAL MAMMOGRAM WITH TOMO
ULTRASOUND LEFT BREAST

[Series 1: us breast*left* limited inc axilla · 0.06mm/px · 5 of 5 slices shown]
[im 1/5]
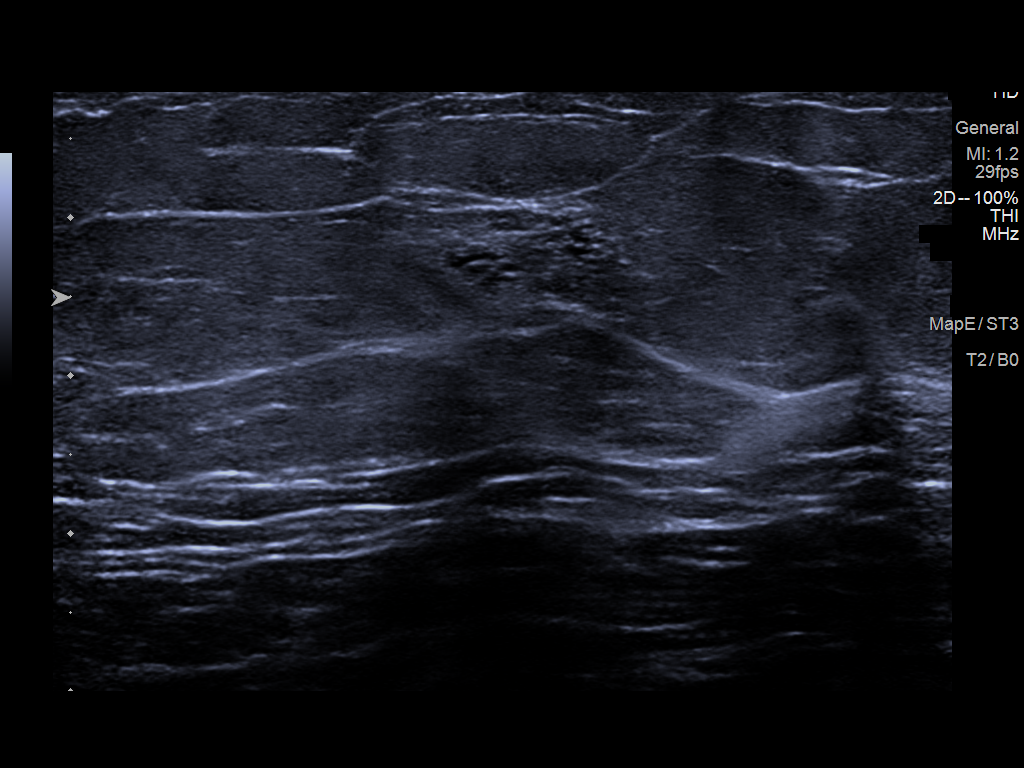
[im 2/5]
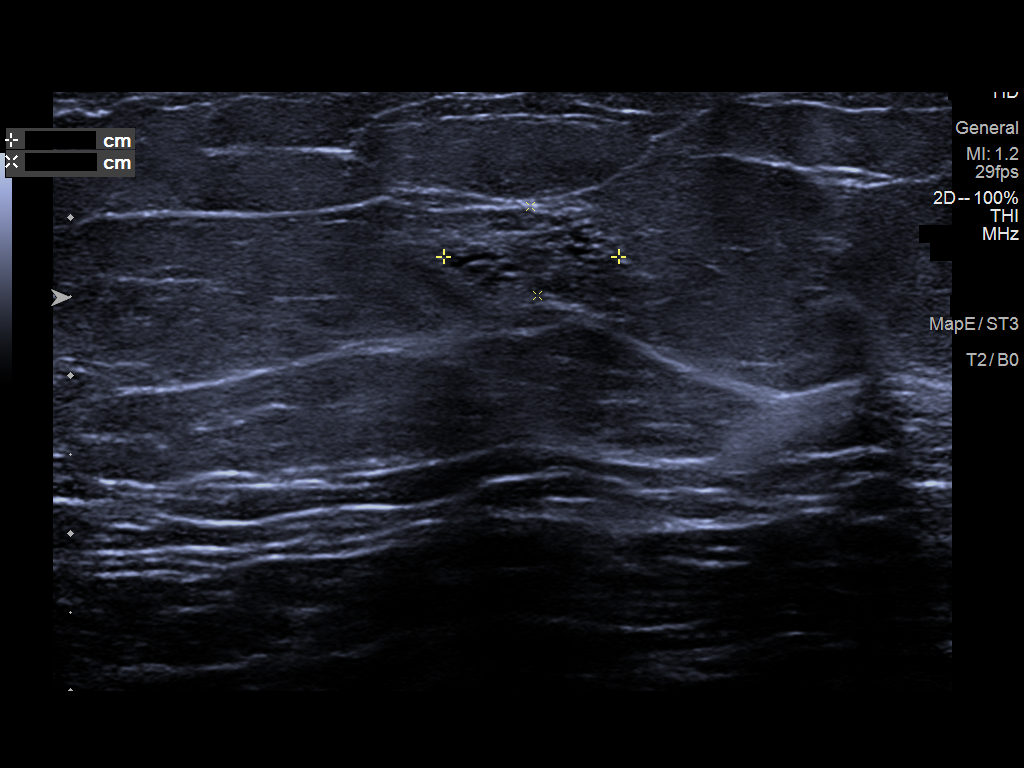
[im 3/5]
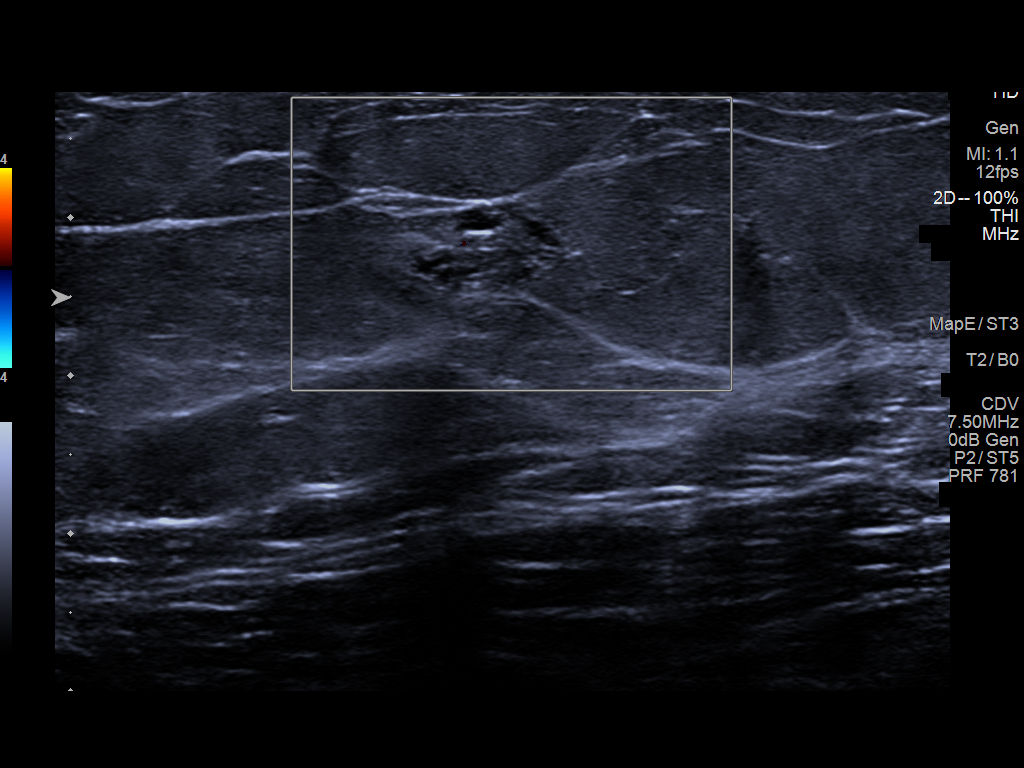
[im 4/5]
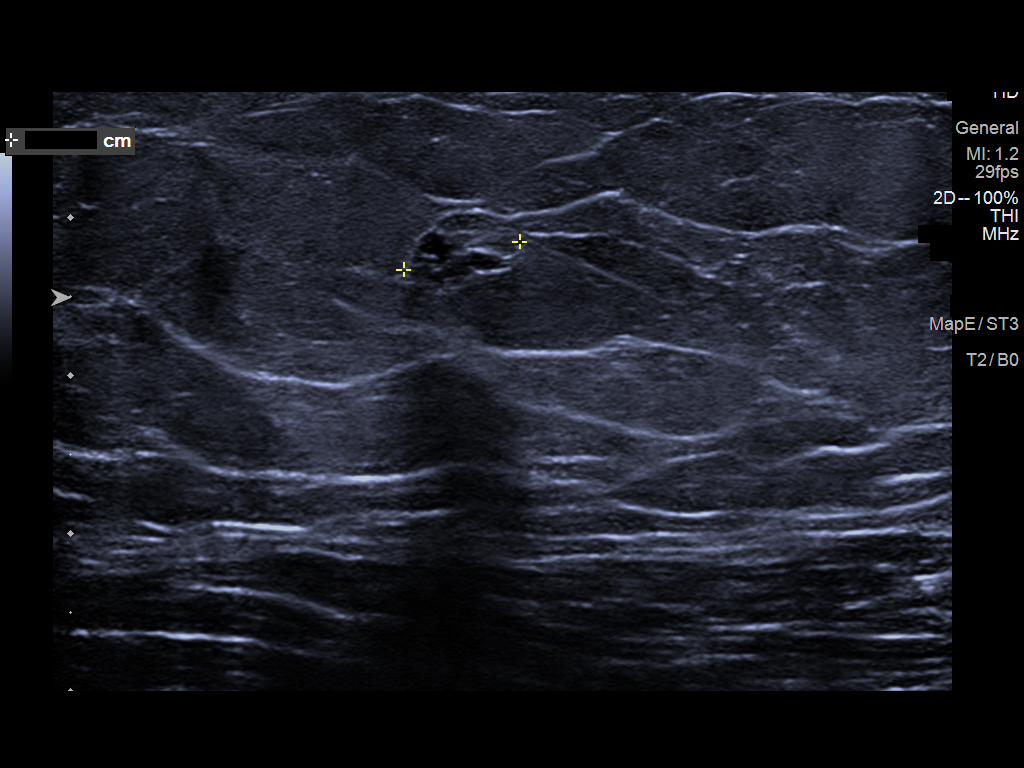
[im 5/5]
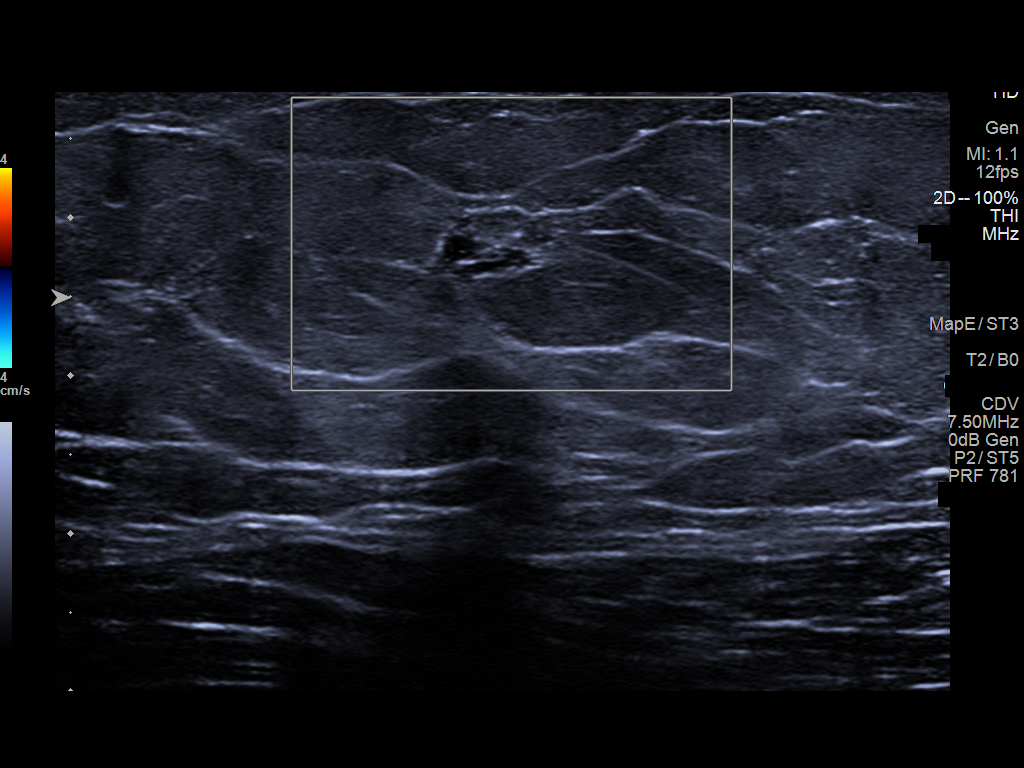

[5 of 5 positions shown; findings below may reference images not displayed]

ACR Breast Density Category b: There are scattered areas of
fibroglandular density.
FINDINGS: There are calcifications spanning between 6 and 7 mm in the right
breast at approximately 12 o'clock. These calcifications are
indeterminate.

There is a new mass in the superior left breast which is isodense
and measures 11 mm.

No other abnormalities.

On physical exam, no suspicious lumps are identified.

Targeted ultrasound is performed, showing either a cluster of cysts
or apocrine metaplasia in the left breast at [DATE], 7 cm from the
nipple measuring 11 x 6 x 8 mm, likely correlating with the
mammographically identified mass.
IMPRESSION: Probably benign left breast mass, likely a cluster of cysts or
apocrine metaplasia. Indeterminate right breast calcifications.

RECOMMENDATION:
Recommend stereotactic biopsy of the right breast calcifications. If
the biopsied calcifications are benign, recommend six-month
follow-up mammogram and ultrasound of the left breast mass. If the
biopsied calcifications require surgery, recommend biopsy of the
right breast mass.

I have discussed the findings and recommendations with the patient.
If applicable, a reminder letter will be sent to the patient
regarding the next appointment.

BI-RADS CATEGORY  4: Suspicious.

ADDENDUM:
There is a typo in the recommendations section. To clarify, the
breast mass is on the left, not the right.

*** End of Addendum ***
ACR Breast Density Category b: There are scattered areas of
fibroglandular density.
FINDINGS: There are calcifications spanning between 6 and 7 mm in the right
breast at approximately 12 o'clock. These calcifications are
indeterminate.

There is a new mass in the superior left breast which is isodense
and measures 11 mm.

No other abnormalities.

On physical exam, no suspicious lumps are identified.

Targeted ultrasound is performed, showing either a cluster of cysts
or apocrine metaplasia in the left breast at [DATE], 7 cm from the
nipple measuring 11 x 6 x 8 mm, likely correlating with the
mammographically identified mass.
IMPRESSION: Probably benign left breast mass, likely a cluster of cysts or
apocrine metaplasia. Indeterminate right breast calcifications.

RECOMMENDATION:
Recommend stereotactic biopsy of the right breast calcifications. If
the biopsied calcifications are benign, recommend six-month
follow-up mammogram and ultrasound of the left breast mass. If the
biopsied calcifications require surgery, recommend biopsy of the
right breast mass.

I have discussed the findings and recommendations with the patient.
If applicable, a reminder letter will be sent to the patient
regarding the next appointment.

BI-RADS CATEGORY  4: Suspicious.

## 2022-07-16 ENCOUNTER — Other Ambulatory Visit: Payer: Self-pay

## 2022-07-16 DIAGNOSIS — Z1231 Encounter for screening mammogram for malignant neoplasm of breast: Secondary | ICD-10-CM

## 2022-09-22 ENCOUNTER — Ambulatory Visit: Payer: BC Managed Care – PPO | Admitting: Surgery

## 2022-09-24 ENCOUNTER — Ambulatory Visit
Admission: RE | Admit: 2022-09-24 | Discharge: 2022-09-24 | Disposition: A | Discharge status: Home / Self Care | Payer: BC Managed Care – PPO | Source: Ambulatory Visit | Attending: Surgery | Admitting: Surgery

## 2022-09-24 DIAGNOSIS — Z1231 Encounter for screening mammogram for malignant neoplasm of breast: Secondary | ICD-10-CM | POA: Diagnosis present

## 2022-10-06 ENCOUNTER — Ambulatory Visit: Payer: BC Managed Care – PPO | Admitting: Surgery

## 2023-09-14 ENCOUNTER — Other Ambulatory Visit: Payer: Self-pay | Admitting: Family Medicine

## 2023-09-14 DIAGNOSIS — Z1231 Encounter for screening mammogram for malignant neoplasm of breast: Secondary | ICD-10-CM

## 2023-09-30 ENCOUNTER — Ambulatory Visit
Admission: RE | Admit: 2023-09-30 | Discharge: 2023-09-30 | Disposition: A | Discharge status: Home / Self Care | Source: Ambulatory Visit | Attending: Family Medicine | Admitting: Family Medicine

## 2023-09-30 DIAGNOSIS — Z1231 Encounter for screening mammogram for malignant neoplasm of breast: Secondary | ICD-10-CM | POA: Insufficient documentation

## 2024-01-11 IMAGING — MG DIGITAL DIAGNOSTIC BILAT W/ TOMO W/ CAD
8 series · 8 of 24 positions shown · non-contrast
Comparison: Previous exam(s).

CLINICAL DATA: Two year follow-up of probable benign apocrine
metaplasia in the left breast originally seen in Thursday July, 2019.

EXAM:
DIGITAL DIAGNOSTIC BILATERAL MAMMOGRAM WITH TOMOSYNTHESIS AND CAD;
ULTRASOUND LEFT BREAST LIMITED
TECHNIQUE: Bilateral digital diagnostic mammography and breast tomosynthesis
was performed. The images were evaluated with computer-aided
detection.; Targeted ultrasound examination of the left breast was
performed.

[R CC synth-2D]
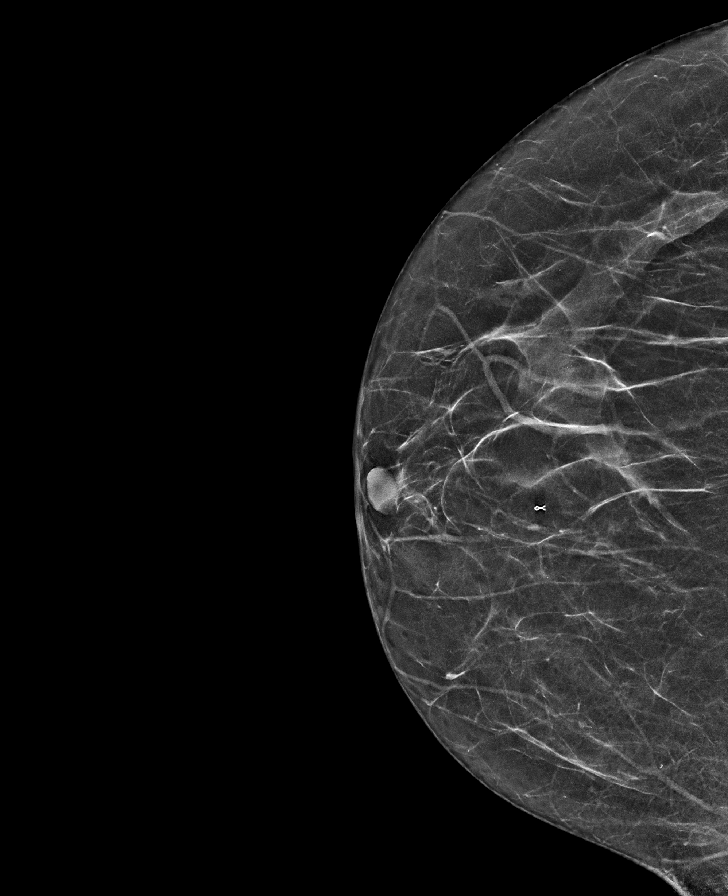

[L CC synth-2D]
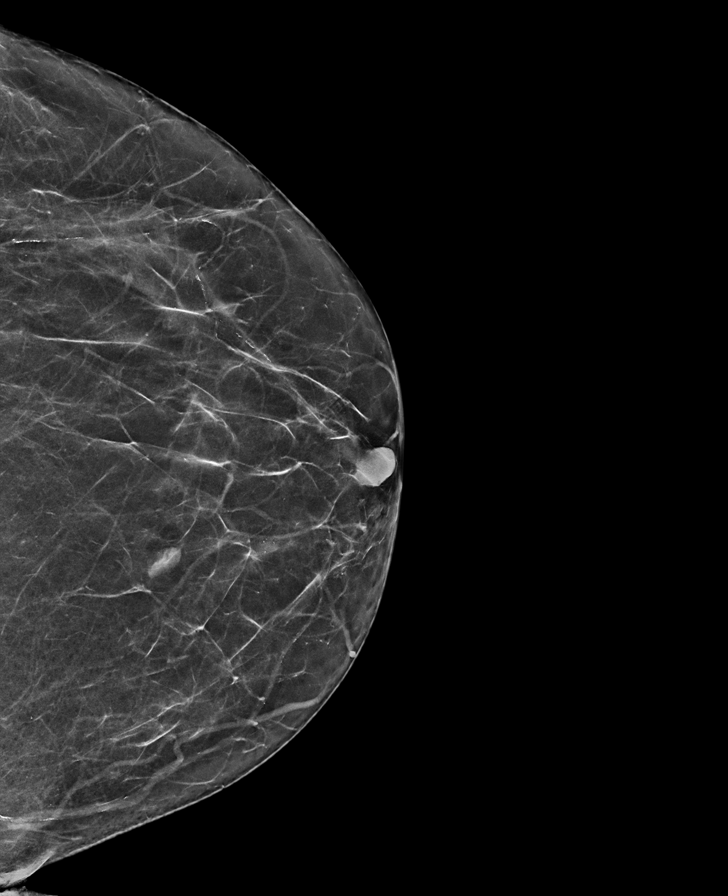

[L MLO synth-2D]
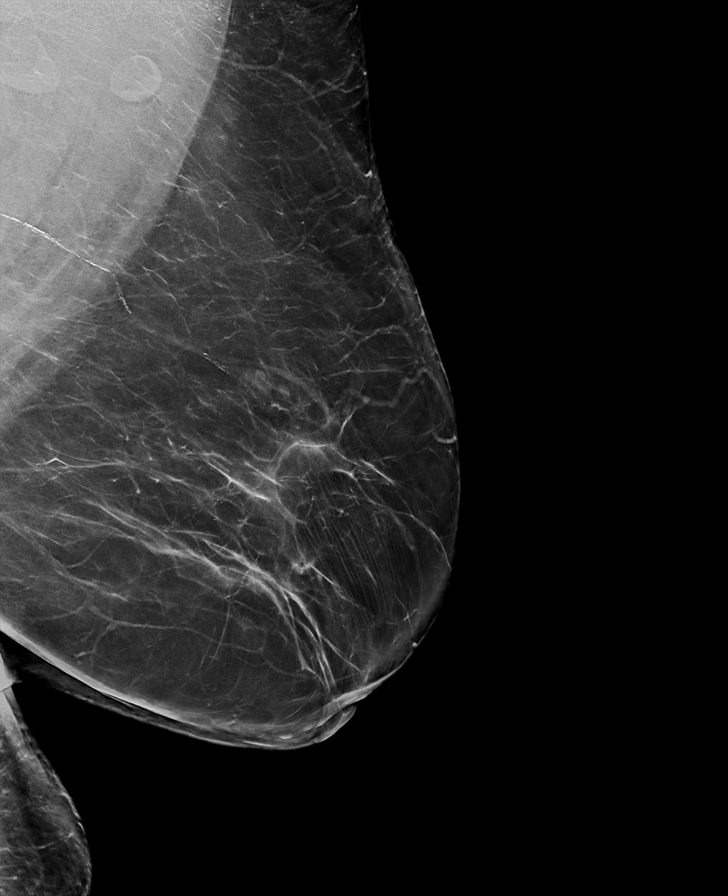

[R MLO synth-2D]
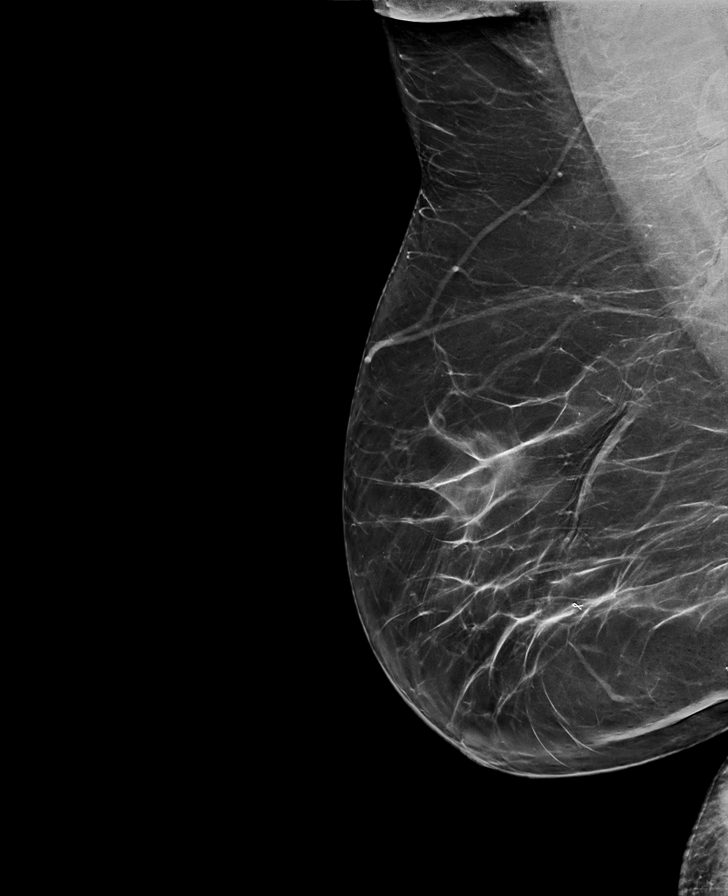

[L CC tomo · tomo slice 31/62.0]
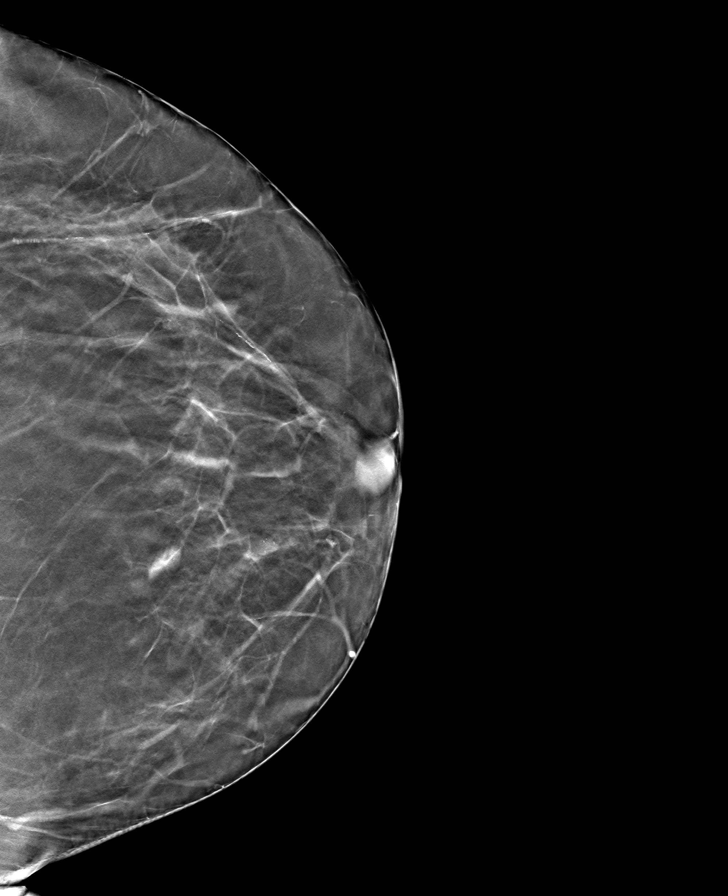

[R MLO tomo · tomo slice 42/83.0]
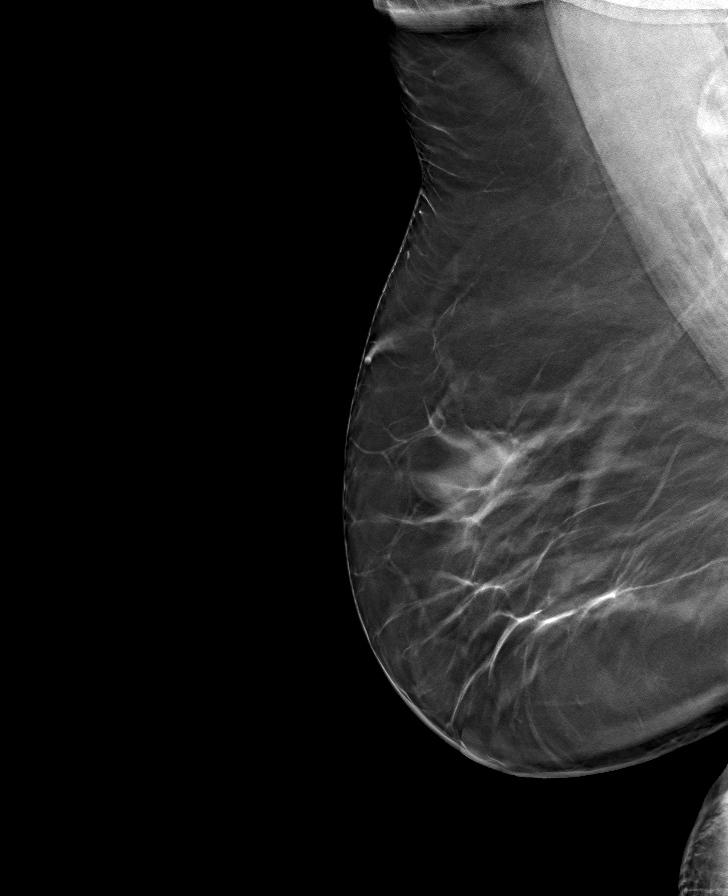

[L MLO tomo · tomo slice 47/92.0]
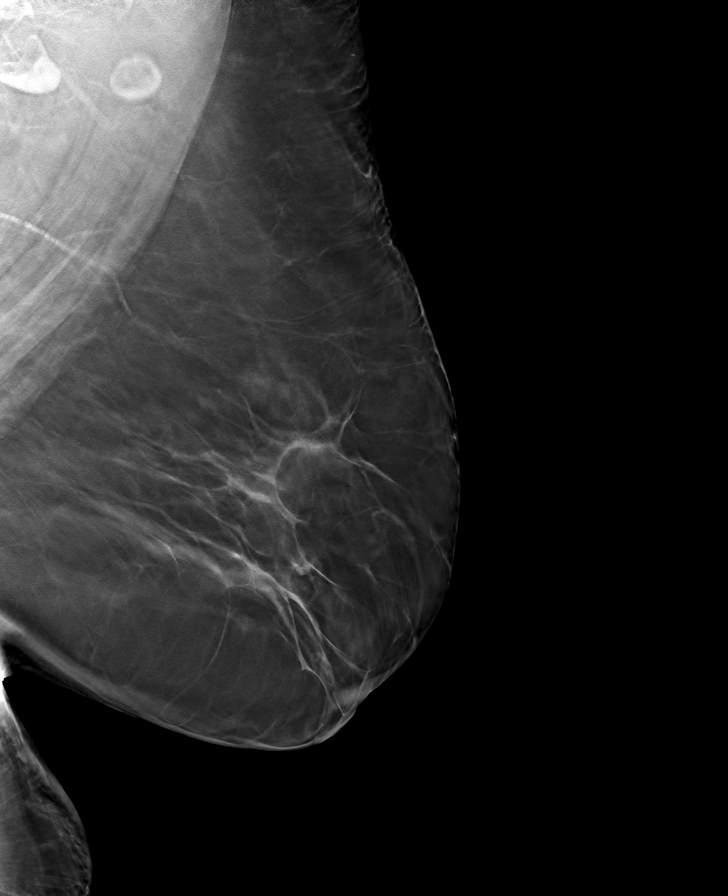

[R CC tomo · tomo slice 29/58.0]
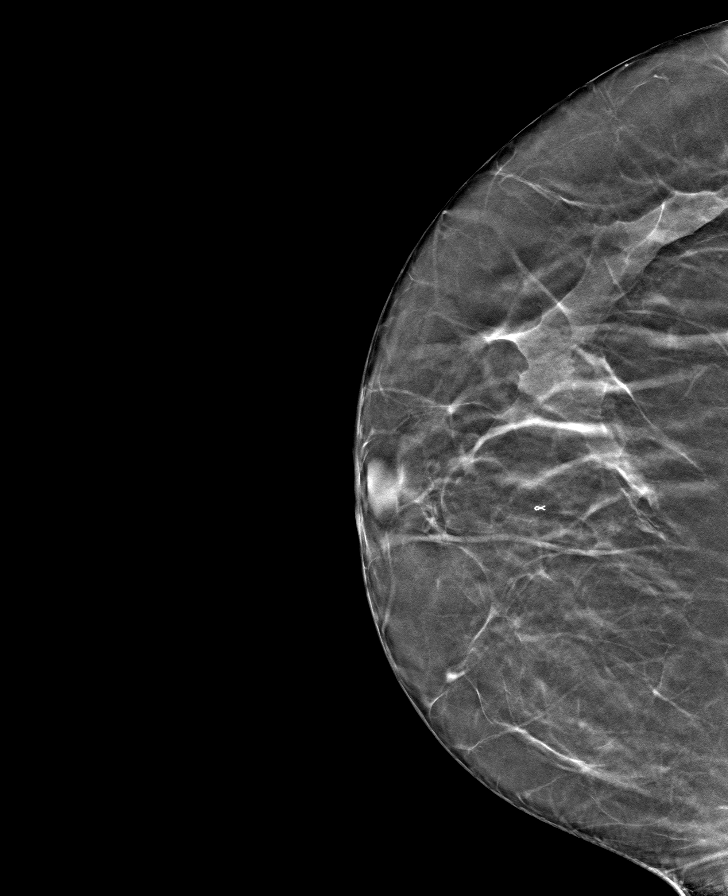

[8 of 24 positions shown; findings below may reference images not displayed]

ACR Breast Density Category b: There are scattered areas of
fibroglandular density.
FINDINGS: Circumscribed mass in the upper-inner quadrant of the left breast is
stable compared to prior exams dating back to Thursday July, 2019. No
suspicious mass or malignant type microcalcifications identified in
either breast.

Targeted ultrasound is performed, showing stable cluster of cysts
(apocrine metaplasia) in the left breast at [DATE] 7 cm from the
nipple measuring 1.2 x 0.5 x 0.6. On the prior ultrasound dated
08/16/2019 it measured 1.1 x 0.6 x 0.8 cm.
IMPRESSION: Stable benign-appearing mass in the left breast. No suspicious mass
identified in either breast.

RECOMMENDATION:
Bilateral screening mammogram in 1 year is recommended.

I have discussed the findings and recommendations with the patient.
If applicable, a reminder letter will be sent to the patient
regarding the next appointment.

BI-RADS CATEGORY  2: Benign.
